# Patient Record
Sex: Male | Born: 1955 | Race: White | Hispanic: No | Marital: Single | Smoking: Former smoker
Health system: Southern US, Community
[De-identification: ages and names within clinical notes are randomized; demographics above are authoritative.]

## PROBLEM LIST (undated history)

## (undated) DIAGNOSIS — M1990 Unspecified osteoarthritis, unspecified site: Secondary | ICD-10-CM

## (undated) DIAGNOSIS — I10 Essential (primary) hypertension: Secondary | ICD-10-CM

---

## 2017-11-11 ENCOUNTER — Inpatient Hospital Stay (HOSPITAL_COMMUNITY)
Admission: EM | Admit: 2017-11-11 | Discharge: 2017-11-29 | DRG: 180 | Disposition: E | Payer: Medicaid Other | Attending: Internal Medicine | Admitting: Internal Medicine

## 2017-11-11 ENCOUNTER — Emergency Department (HOSPITAL_COMMUNITY): Payer: Medicaid Other

## 2017-11-11 ENCOUNTER — Encounter (HOSPITAL_COMMUNITY): Payer: Self-pay | Admitting: Emergency Medicine

## 2017-11-11 DIAGNOSIS — Z885 Allergy status to narcotic agent status: Secondary | ICD-10-CM

## 2017-11-11 DIAGNOSIS — N289 Disorder of kidney and ureter, unspecified: Secondary | ICD-10-CM | POA: Diagnosis present

## 2017-11-11 DIAGNOSIS — W19XXXA Unspecified fall, initial encounter: Secondary | ICD-10-CM | POA: Diagnosis present

## 2017-11-11 DIAGNOSIS — E440 Moderate protein-calorie malnutrition: Secondary | ICD-10-CM | POA: Diagnosis present

## 2017-11-11 DIAGNOSIS — J929 Pleural plaque without asbestos: Secondary | ICD-10-CM | POA: Diagnosis present

## 2017-11-11 DIAGNOSIS — J910 Malignant pleural effusion: Secondary | ICD-10-CM | POA: Diagnosis present

## 2017-11-11 DIAGNOSIS — F17210 Nicotine dependence, cigarettes, uncomplicated: Secondary | ICD-10-CM | POA: Diagnosis present

## 2017-11-11 DIAGNOSIS — Z720 Tobacco use: Secondary | ICD-10-CM

## 2017-11-11 DIAGNOSIS — D689 Coagulation defect, unspecified: Secondary | ICD-10-CM | POA: Diagnosis present

## 2017-11-11 DIAGNOSIS — Z886 Allergy status to analgesic agent status: Secondary | ICD-10-CM

## 2017-11-11 DIAGNOSIS — E871 Hypo-osmolality and hyponatremia: Secondary | ICD-10-CM | POA: Diagnosis present

## 2017-11-11 DIAGNOSIS — R262 Difficulty in walking, not elsewhere classified: Secondary | ICD-10-CM | POA: Diagnosis present

## 2017-11-11 DIAGNOSIS — R64 Cachexia: Secondary | ICD-10-CM | POA: Diagnosis present

## 2017-11-11 DIAGNOSIS — J9811 Atelectasis: Secondary | ICD-10-CM | POA: Diagnosis present

## 2017-11-11 DIAGNOSIS — Z66 Do not resuscitate: Secondary | ICD-10-CM | POA: Diagnosis present

## 2017-11-11 DIAGNOSIS — I10 Essential (primary) hypertension: Secondary | ICD-10-CM | POA: Diagnosis present

## 2017-11-11 DIAGNOSIS — Z880 Allergy status to penicillin: Secondary | ICD-10-CM

## 2017-11-11 DIAGNOSIS — R6510 Systemic inflammatory response syndrome (SIRS) of non-infectious origin without acute organ dysfunction: Secondary | ICD-10-CM | POA: Diagnosis present

## 2017-11-11 DIAGNOSIS — G9341 Metabolic encephalopathy: Secondary | ICD-10-CM

## 2017-11-11 DIAGNOSIS — Z515 Encounter for palliative care: Secondary | ICD-10-CM | POA: Diagnosis present

## 2017-11-11 DIAGNOSIS — Z9889 Other specified postprocedural states: Secondary | ICD-10-CM

## 2017-11-11 DIAGNOSIS — R000 Tachycardia, unspecified: Secondary | ICD-10-CM | POA: Diagnosis present

## 2017-11-11 DIAGNOSIS — Z91018 Allergy to other foods: Secondary | ICD-10-CM

## 2017-11-11 DIAGNOSIS — D649 Anemia, unspecified: Secondary | ICD-10-CM | POA: Diagnosis present

## 2017-11-11 DIAGNOSIS — R17 Unspecified jaundice: Secondary | ICD-10-CM | POA: Diagnosis present

## 2017-11-11 DIAGNOSIS — F1020 Alcohol dependence, uncomplicated: Secondary | ICD-10-CM | POA: Diagnosis present

## 2017-11-11 DIAGNOSIS — M069 Rheumatoid arthritis, unspecified: Secondary | ICD-10-CM | POA: Diagnosis present

## 2017-11-11 DIAGNOSIS — I493 Ventricular premature depolarization: Secondary | ICD-10-CM | POA: Diagnosis present

## 2017-11-11 DIAGNOSIS — R296 Repeated falls: Secondary | ICD-10-CM | POA: Diagnosis present

## 2017-11-11 DIAGNOSIS — R531 Weakness: Secondary | ICD-10-CM

## 2017-11-11 DIAGNOSIS — R6889 Other general symptoms and signs: Secondary | ICD-10-CM | POA: Diagnosis present

## 2017-11-11 DIAGNOSIS — E876 Hypokalemia: Secondary | ICD-10-CM | POA: Diagnosis present

## 2017-11-11 DIAGNOSIS — C384 Malignant neoplasm of pleura: Principal | ICD-10-CM | POA: Diagnosis present

## 2017-11-11 DIAGNOSIS — R911 Solitary pulmonary nodule: Secondary | ICD-10-CM | POA: Diagnosis present

## 2017-11-11 DIAGNOSIS — Z681 Body mass index (BMI) 19 or less, adult: Secondary | ICD-10-CM

## 2017-11-11 DIAGNOSIS — E861 Hypovolemia: Secondary | ICD-10-CM | POA: Diagnosis present

## 2017-11-11 DIAGNOSIS — G92 Toxic encephalopathy: Secondary | ICD-10-CM | POA: Diagnosis not present

## 2017-11-11 DIAGNOSIS — J90 Pleural effusion, not elsewhere classified: Secondary | ICD-10-CM

## 2017-11-11 HISTORY — DX: Unspecified osteoarthritis, unspecified site: M19.90

## 2017-11-11 HISTORY — DX: Essential (primary) hypertension: I10

## 2017-11-11 LAB — BASIC METABOLIC PANEL
ANION GAP: 18 — AB (ref 5–15)
BUN: 48 mg/dL — AB (ref 6–20)
CALCIUM: 8.1 mg/dL — AB (ref 8.9–10.3)
CO2: 20 mmol/L — ABNORMAL LOW (ref 22–32)
Chloride: 90 mmol/L — ABNORMAL LOW (ref 101–111)
Creatinine, Ser: 1.56 mg/dL — ABNORMAL HIGH (ref 0.61–1.24)
GFR calc Af Amer: 53 mL/min — ABNORMAL LOW (ref >60)
GFR, EST NON AFRICAN AMERICAN: 46 mL/min — AB (ref >60)
GLUCOSE: 132 mg/dL — AB (ref 65–99)
POTASSIUM: 3.4 mmol/L — AB (ref 3.5–5.1)
SODIUM: 128 mmol/L — AB (ref 135–145)

## 2017-11-11 LAB — POC OCCULT BLOOD, ED: Fecal Occult Bld: NEGATIVE

## 2017-11-11 LAB — HEPATIC FUNCTION PANEL
ALT: 18 U/L (ref 17–63)
AST: 29 U/L (ref 15–41)
Albumin: 2.2 g/dL — ABNORMAL LOW (ref 3.5–5.0)
Alkaline Phosphatase: 70 U/L (ref 38–126)
BILIRUBIN DIRECT: 1.1 mg/dL — AB (ref 0.1–0.5)
BILIRUBIN INDIRECT: 1.2 mg/dL — AB (ref 0.3–0.9)
TOTAL PROTEIN: 5.9 g/dL — AB (ref 6.5–8.1)
Total Bilirubin: 2.3 mg/dL — ABNORMAL HIGH (ref 0.3–1.2)

## 2017-11-11 LAB — CBC
HEMATOCRIT: 37.7 % — AB (ref 39.0–52.0)
Hemoglobin: 12.9 g/dL — ABNORMAL LOW (ref 13.0–17.0)
MCH: 31.3 pg (ref 26.0–34.0)
MCHC: 34.2 g/dL (ref 30.0–36.0)
MCV: 91.5 fL (ref 78.0–100.0)
PLATELETS: 222 10*3/uL (ref 150–400)
RBC: 4.12 MIL/uL — ABNORMAL LOW (ref 4.22–5.81)
RDW: 13.3 % (ref 11.5–15.5)
WBC: 17.7 10*3/uL — AB (ref 4.0–10.5)

## 2017-11-11 LAB — PROTIME-INR
INR: 1.44
Prothrombin Time: 17.4 seconds — ABNORMAL HIGH (ref 11.4–15.2)

## 2017-11-11 LAB — I-STAT CG4 LACTIC ACID, ED: LACTIC ACID, VENOUS: 3.61 mmol/L — AB (ref 0.5–1.9)

## 2017-11-11 LAB — TYPE AND SCREEN
ABO/RH(D): O POS
ANTIBODY SCREEN: NEGATIVE

## 2017-11-11 LAB — CBG MONITORING, ED: GLUCOSE-CAPILLARY: 134 mg/dL — AB (ref 65–99)

## 2017-11-11 LAB — LIPASE, BLOOD: LIPASE: 34 U/L (ref 11–51)

## 2017-11-11 LAB — APTT: aPTT: 33 seconds (ref 24–36)

## 2017-11-11 LAB — ABO/RH: ABO/RH(D): O POS

## 2017-11-11 MED ORDER — SODIUM CHLORIDE 0.9 % IV SOLN: 2.0000 g | Freq: Once | INTRAVENOUS | Status: DC

## 2017-11-11 MED ORDER — VANCOMYCIN HCL IN DEXTROSE 1-5 GM/200ML-% IV SOLN
1000.0000 mg | Freq: Once | INTRAVENOUS | Status: AC
  Administered 2017-11-11: 1000 mg via INTRAVENOUS
  Filled 2017-11-11: qty 200

## 2017-11-11 MED ORDER — VANCOMYCIN HCL IN DEXTROSE 750-5 MG/150ML-% IV SOLN
750.0000 mg | INTRAVENOUS | Status: DC
  Administered 2017-11-12: 750 mg via INTRAVENOUS
  Filled 2017-11-11: qty 150

## 2017-11-11 MED ORDER — SODIUM CHLORIDE 0.9 % IV BOLUS (SEPSIS)
500.0000 mL | Freq: Once | INTRAVENOUS | Status: AC
  Administered 2017-11-12: 500 mL via INTRAVENOUS

## 2017-11-11 MED ORDER — SODIUM CHLORIDE 0.9 % IV BOLUS (SEPSIS)
1000.0000 mL | Freq: Once | INTRAVENOUS | Status: AC
  Administered 2017-11-11: 1000 mL via INTRAVENOUS

## 2017-11-11 MED ORDER — LEVOFLOXACIN IN D5W 750 MG/150ML IV SOLN: 750.0000 mg | Freq: Once | INTRAVENOUS | Status: DC

## 2017-11-11 MED ORDER — IOPAMIDOL (ISOVUE-300) INJECTION 61%
INTRAVENOUS | Status: AC
  Administered 2017-11-11: 100 mL
  Filled 2017-11-11: qty 100

## 2017-11-11 MED ORDER — SODIUM CHLORIDE 0.9 % IV SOLN
2.0000 g | INTRAVENOUS | Status: DC
  Administered 2017-11-12: 2 g via INTRAVENOUS
  Filled 2017-11-11: qty 2

## 2017-11-11 MED ORDER — SODIUM CHLORIDE 0.9 % IV BOLUS (SEPSIS): 250.0000 mL | Freq: Once | INTRAVENOUS | Status: DC

## 2017-11-11 MED ORDER — IOPAMIDOL (ISOVUE-300) INJECTION 61%
INTRAVENOUS | Status: AC
  Filled 2017-11-11: qty 75

## 2017-11-11 MED ORDER — SODIUM CHLORIDE 0.9 % IV SOLN
2.0000 g | Freq: Once | INTRAVENOUS | Status: AC
  Administered 2017-11-11: 2 g via INTRAVENOUS
  Filled 2017-11-11: qty 2

## 2017-11-11 NOTE — ED Provider Notes (Signed)
CXR shows large right pleural effusion.  CT abdomen is worrisome for malignant effusion with mets to pleural surface.  Likely new cancer diagnosis.    I informed the patient of the CT results and the concern that this could be malignant.    11:56 PM Unable to obtain chest CT at this time due to previous contrasted CT of abdomen.  Will consult medicine for admission.   Montine Circle, PA-C 11/12/17 Charlyn Minerva, April, MD 11/12/17 Laureen Abrahams

## 2017-11-11 NOTE — ED Provider Notes (Addendum)
[facility] EMERGENCY DEPARTMENT Provider Note   CSN: [id number] Arrival date & time: 11/12/2017  2037     History   Chief Complaint Chief Complaint  Patient presents with  . Weakness  . Abdominal Pain    HPI Billy Heath is a 62 y.o. male with a history of hypertension and arthritis in by EMS to the emergency department today for 2-3 weeks of weakness and abdominal pain. Patient reports over the last 2-3 weeks he has had progressive weakness to the point he has difficulty ambulating and now has fallen several times.  He notes that he has lost 20-30 lbs during that time and has no appetite. He reports constant nausea.  He repots generalized abdominal pain with daily dark diarrhea. He is obviously jaundice and reports that he has drank 6 beers/day since turning 20. He also reports that he has severe arthritis and takes 10+ iburpofen daily for the last 10 years. He reports productive cough with green phlegm that has been worsening since onset of his symptoms. He notes some occasional shortness of breath with this but no chest pain. He denies history of COPD but does have a 116 pack year history (3PPD since age 63).  EMS arrived they found that his blood pressure was 82/70 and he received 600 mL's of normal saline in route.  Patient does not follow with a primary care doctor or take any daily medications other than the ibuprofen. He denies any fever, chills, body aches, chills, chest pain, emesis. Does report some decreased urinary output but denies dysuria, hematuria, or urgency.   HPI  Past Medical History:  Diagnosis Date  . Arthritis   . Hypertension     There are no active problems to display for this patient.   The histories are not reviewed yet. Please review them in the "History" navigator section and refresh this [place].    Home Medications    Prior to Admission medications   Not on File    Family History No family history on file.  Social  History Social History   Tobacco Use  . Smoking status: Former Smoker    Last attempt to quit: 10/21/2017    Years since quitting: 0.0  . Smokeless tobacco: Never Used  Substance Use Topics  . Alcohol use: Yes    Alcohol/week: 7.2 oz    Types: 12 Cans of beer per week  . Drug use: No     Allergies   Patient has no allergy information on record.   Review of Systems Review of Systems  All other systems reviewed and are negative.    Physical Exam Updated Vital Signs BP 128/75   Pulse (!) 105   Temp (!) 97.5 F (36.4 C) (Axillary)   Resp 19   Ht 5' 7"  (1.702 m)   Wt 57.6 kg (127 lb)   SpO2 97%   BMI 19.89 kg/m   Physical Exam  Constitutional:  Patient appears frail, older than stated age.  Cachectic.  HENT:  Head: Normocephalic and atraumatic.  Right Ear: External ear normal.  Left Ear: External ear normal.  Nose: Nose normal.  Mouth/Throat: Uvula is midline, oropharynx is clear and moist and mucous membranes are normal. No tonsillar exudate.  Eyes: Pupils are equal, round, and reactive to light. Right eye exhibits no discharge. Left eye exhibits no discharge. No scleral icterus.  Scleral icterus  Neck: Trachea normal. Neck supple. No JVD present. No spinous process tenderness present. Carotid bruit is not present.  No neck rigidity. Normal range of motion present.  Cardiovascular: Normal rate, regular rhythm and intact distal pulses.  No murmur heard. Pulses:      Radial pulses are 2+ on the right side, and 2+ on the left side.       Dorsalis pedis pulses are 2+ on the right side, and 2+ on the left side.       Posterior tibial pulses are 2+ on the right side, and 2+ on the left side.  No lower extremity swelling or edema. Calves symmetric in size bilaterally.  Pulmonary/Chest: Effort normal. No accessory muscle usage. No tachypnea. No respiratory distress. He has decreased breath sounds in the right upper field and the right middle field. He has rhonchi in the  right lower field. He exhibits no tenderness.  Abdominal: Soft. Bowel sounds are normal. There is no hepatosplenomegaly. There is generalized tenderness and tenderness in the right upper quadrant, epigastric area and left upper quadrant. There is no rigidity, no rebound, no guarding and no CVA tenderness.  Genitourinary:  Genitourinary Comments: Chaperone was present.  Patient without pain around the rectal area. Digital Rectal Exam reveals sphincter with good tone. No masses palpated. Stool color is brown with no overt blood or melena.  Musculoskeletal: He exhibits no edema.  Severe arthritic changes of the hands.  Lymphadenopathy:    He has no cervical adenopathy.  Neurological: He is alert.  Skin: Skin is warm and dry. No rash noted. He is not diaphoretic.  Jaundice  Psychiatric: He has a normal mood and affect.  Nursing note and vitals reviewed.    ED Treatments / Results  Labs (all labs ordered are listed, but only abnormal results are displayed) Labs Reviewed  BASIC METABOLIC PANEL - Abnormal; Notable for the following components:      Result Value   Sodium 128 (*)    Potassium 3.4 (*)    Chloride 90 (*)    CO2 20 (*)    Glucose, Bld 132 (*)    BUN 48 (*)    Creatinine, Ser 1.56 (*)    Calcium 8.1 (*)    GFR calc non Af Amer 46 (*)    GFR calc Af Amer 53 (*)    Anion gap 18 (*)    All other components within normal limits  CBC - Abnormal; Notable for the following components:   WBC 17.7 (*)    RBC 4.12 (*)    Hemoglobin 12.9 (*)    HCT 37.7 (*)    All other components within normal limits  HEPATIC FUNCTION PANEL - Abnormal; Notable for the following components:   Total Protein 5.9 (*)    Albumin 2.2 (*)    Total Bilirubin 2.3 (*)    Bilirubin, Direct 1.1 (*)    Indirect Bilirubin 1.2 (*)    All other components within normal limits  CBG MONITORING, ED - Abnormal; Notable for the following components:   Glucose-Capillary 134 (*)    All other components within  normal limits  I-STAT CG4 LACTIC ACID, ED - Abnormal; Notable for the following components:   Lactic Acid, Venous 3.61 (*)    All other components within normal limits  CULTURE, BLOOD (ROUTINE X 2)  CULTURE, BLOOD (ROUTINE X 2)  LIPASE, BLOOD  URINALYSIS, ROUTINE W REFLEX MICROSCOPIC  OCCULT BLOOD X 1 CARD TO LAB, STOOL  PROTIME-INR  APTT  AMMONIA  POC OCCULT BLOOD, ED  TYPE AND SCREEN    EKG  EKG Interpretation None  Radiology No results found.  Procedures Procedures (including critical care time)  Medications Ordered in ED Medications  iopamidol (ISOVUE-300) 61 % injection (not administered)  sodium chloride 0.9 % bolus 1,000 mL (not administered)    And  sodium chloride 0.9 % bolus 500 mL (not administered)    And  sodium chloride 0.9 % bolus 250 mL (not administered)  levofloxacin (LEVAQUIN) IVPB 750 mg (not administered)  aztreonam (AZACTAM) 2 g in sodium chloride 0.9 % 100 mL IVPB (not administered)  vancomycin (VANCOCIN) IVPB 1000 mg/200 mL premix (not administered)     Initial Impression / Assessment and Plan / ED Course  I have reviewed the triage vital signs and the nursing notes.  Pertinent labs & imaging results that were available during my care of the patient were reviewed by me and considered in my medical decision making (see chart for details).     This is a 62 year old frail and ill appearing male that has not had established outpatient medical care in some time presenting with generalized weakness, generalized abdominal pain, nausea, diarrhea, anorexia, 20-30 lb weight loss, and a worsening productive cough over the last 2-3 weeks. Patient was initial found by ems with bp of 82/70 that recovered after IVF.  On arrival the patient is noted to be tachycardic at 105.  He was at home and is afebrile here.  Will obtain lactic acid and rectal temperature to evaluate for sepsis. Patient reports chronic alcohol use and chronic ibuprofen use. He has  generalized abdominal tenderness without peritoneal signs and reports dark diarrhea.  There is concern for upper GI bleed given soft blood pressure on arrival and history.  Will obtain fecal occult blood card and labs anticipating possible transfusion if needed.  CT scan of the abdomen ordered.  Patient also reports 116-pack-year history.  He appears cachectic with decreased breath sounds in the right and rhonchi of the right lower lobe.  Will obtain chest x-ray to evaluate.  Will also send CBC, CMP, lipase, ammonia, urinalysis, and obtain ECG.  Patient rectal exam without occult blood.  Stool guaiac negative.  Patient without anemia that would require transfusion.  Do not feel patient needs further workup for suspected upper GI bleed at this time.  Patient noted to have lactic acid of 3.61.  He has a leukocytosis of 17.  Documented blood pressure of 82/70 by EMS and is tachycardic here.  Patient meets sepsis criteria.  Code sepsis called.  Patient started on broad-spectrum antibiotics for unknown source.   Patient noted to have hyponatremia of 128 and mild hypokalemia of 3.4.  Patient with creatinine of 1.56 and BUN of 48.  Consistent with dehydration.  Unknown if acute kidney injury given no prior labs.  Patient with anion gap of 18 and CO2 of 20.  Albumin noted to be low at 2.2.  Bilirubin elevated 2.3.  AST, ALT and alk phos within normal limits.  With remaining workup pending case signed out to Lorre Munroe, PA-C with anticipation for admission.  Final Clinical Impressions(s) / ED Diagnoses   Final diagnoses:  None    ED Discharge Orders    None       Jillyn Ledger, PA-C 11/25/2017 2254    Jillyn Ledger, PA-C 10/31/2017 2255    Fredia Sorrow, MD 11/12/17 [phone]

## 2017-11-11 NOTE — ED Notes (Signed)
Pt remains in imaging at this time, unable to hang antibiotics at this time

## 2017-11-11 NOTE — Progress Notes (Signed)
Pharmacy Antibiotic Note  Billy Heath is a 62 y.o. male who presented to the [facility] on 11/12/2017 with weakness, abdominal pain, SOB, and hypotension concerning for sepsis. Pharmacy consulted to start Vancomycin + Cefepime for r/o sepsis.   The patient is noted to have a allergy of penicillin as rash. MD okay with trialing a cephalosporin. Discussed monitoring parameters with RN.   Plan: 1. Vancomycin 1g IV x 1 followed by 750 mg IV every 24 hours 2. Cefepime 2g IV x 1 followed by 2g IV every 24 hours 3. Will continue to follow renal function, culture results, LOT, and antibiotic de-escalation plans   Height: 5\' 7"  (170.2 cm) Weight: 127 lb (57.6 kg) IBW/kg (Calculated) : 66.1  Temp (24hrs), Avg:97.5 F (36.4 C), Min:97.5 F (36.4 C), Max:97.5 F (36.4 C)  Recent Labs  Lab 11/08/2017 2100 11/18/2017 2211  WBC 17.7*  --   CREATININE 1.56*  --   LATICACIDVEN  --  3.61*    Estimated Creatinine Clearance: 39.5 mL/min (A) (by C-G formula based on SCr of 1.56 mg/dL (H)).    Allergies  Allergen Reactions  . Onion Shortness Of Breath and Other (See Comments)    Has to take Benadryl to tolerate onions  . Asa [Aspirin] Other (See Comments)    Causes nosebleeds bad enough to warrant a trip to the hospital  . Codeine Nausea And Vomiting  . Penicillins Rash    From childhood: Has patient had a PCN reaction causing immediate rash, facial/tongue/throat swelling, SOB or lightheadedness with hypotension: Yes Has patient had a PCN reaction causing severe rash involving mucus membranes or skin necrosis: Unk Has patient had a PCN reaction that required hospitalization: No Has patient had a PCN reaction occurring within the last 10 years: No If all of the above answers are "NO", then may proceed with Cephalosporin use.     Antimicrobials this admission: Vanc 3/14 >> Cefepime 3/14 >>  Dose adjustments this admission:   Microbiology results: 3/14 BCx >>   Thank you for allowing pharmacy to  be a part of this patient's care.  Lawson Radar 10/31/2017 10:20 PM

## 2017-11-11 NOTE — ED Triage Notes (Signed)
Per [facility], Pt from home. Pt has been having weakness for past 2 weeks. Pt complaining of generalized abdominal pain (8/10), nausea, diarrhea. Pt has also had multiple falls since Monday. EMS reports initial BP 82/70. Pt received 600 ml NS en route. BP currently 128/75.

## 2017-11-11 NOTE — ED Notes (Signed)
Delay in lab draw,  Pt not in room 

## 2017-11-12 ENCOUNTER — Inpatient Hospital Stay (HOSPITAL_COMMUNITY): Payer: Medicaid Other

## 2017-11-12 ENCOUNTER — Other Ambulatory Visit: Payer: Self-pay

## 2017-11-12 ENCOUNTER — Encounter (HOSPITAL_COMMUNITY): Payer: Self-pay | Admitting: Family Medicine

## 2017-11-12 DIAGNOSIS — F1020 Alcohol dependence, uncomplicated: Secondary | ICD-10-CM | POA: Diagnosis present

## 2017-11-12 DIAGNOSIS — R000 Tachycardia, unspecified: Secondary | ICD-10-CM | POA: Diagnosis present

## 2017-11-12 DIAGNOSIS — E876 Hypokalemia: Secondary | ICD-10-CM | POA: Diagnosis present

## 2017-11-12 DIAGNOSIS — J90 Pleural effusion, not elsewhere classified: Secondary | ICD-10-CM | POA: Diagnosis present

## 2017-11-12 DIAGNOSIS — Z880 Allergy status to penicillin: Secondary | ICD-10-CM | POA: Diagnosis not present

## 2017-11-12 DIAGNOSIS — R6889 Other general symptoms and signs: Secondary | ICD-10-CM | POA: Diagnosis not present

## 2017-11-12 DIAGNOSIS — W19XXXA Unspecified fall, initial encounter: Secondary | ICD-10-CM | POA: Diagnosis present

## 2017-11-12 DIAGNOSIS — Z886 Allergy status to analgesic agent status: Secondary | ICD-10-CM | POA: Diagnosis not present

## 2017-11-12 DIAGNOSIS — J929 Pleural plaque without asbestos: Secondary | ICD-10-CM | POA: Diagnosis not present

## 2017-11-12 DIAGNOSIS — D689 Coagulation defect, unspecified: Secondary | ICD-10-CM | POA: Diagnosis present

## 2017-11-12 DIAGNOSIS — R17 Unspecified jaundice: Secondary | ICD-10-CM

## 2017-11-12 DIAGNOSIS — R262 Difficulty in walking, not elsewhere classified: Secondary | ICD-10-CM | POA: Diagnosis present

## 2017-11-12 DIAGNOSIS — E861 Hypovolemia: Secondary | ICD-10-CM | POA: Diagnosis present

## 2017-11-12 DIAGNOSIS — J910 Malignant pleural effusion: Secondary | ICD-10-CM | POA: Diagnosis present

## 2017-11-12 DIAGNOSIS — R64 Cachexia: Secondary | ICD-10-CM | POA: Diagnosis present

## 2017-11-12 DIAGNOSIS — R911 Solitary pulmonary nodule: Secondary | ICD-10-CM | POA: Diagnosis present

## 2017-11-12 DIAGNOSIS — R296 Repeated falls: Secondary | ICD-10-CM | POA: Diagnosis present

## 2017-11-12 DIAGNOSIS — J9811 Atelectasis: Secondary | ICD-10-CM | POA: Diagnosis present

## 2017-11-12 DIAGNOSIS — N289 Disorder of kidney and ureter, unspecified: Secondary | ICD-10-CM | POA: Diagnosis present

## 2017-11-12 DIAGNOSIS — G92 Toxic encephalopathy: Secondary | ICD-10-CM | POA: Diagnosis not present

## 2017-11-12 DIAGNOSIS — E871 Hypo-osmolality and hyponatremia: Secondary | ICD-10-CM | POA: Diagnosis present

## 2017-11-12 DIAGNOSIS — I493 Ventricular premature depolarization: Secondary | ICD-10-CM | POA: Diagnosis present

## 2017-11-12 DIAGNOSIS — E440 Moderate protein-calorie malnutrition: Secondary | ICD-10-CM | POA: Diagnosis present

## 2017-11-12 DIAGNOSIS — Z681 Body mass index (BMI) 19 or less, adult: Secondary | ICD-10-CM | POA: Diagnosis not present

## 2017-11-12 DIAGNOSIS — I10 Essential (primary) hypertension: Secondary | ICD-10-CM | POA: Diagnosis present

## 2017-11-12 DIAGNOSIS — C384 Malignant neoplasm of pleura: Secondary | ICD-10-CM | POA: Diagnosis present

## 2017-11-12 DIAGNOSIS — R6510 Systemic inflammatory response syndrome (SIRS) of non-infectious origin without acute organ dysfunction: Secondary | ICD-10-CM | POA: Diagnosis present

## 2017-11-12 DIAGNOSIS — M069 Rheumatoid arthritis, unspecified: Secondary | ICD-10-CM | POA: Diagnosis present

## 2017-11-12 DIAGNOSIS — Z885 Allergy status to narcotic agent status: Secondary | ICD-10-CM | POA: Diagnosis not present

## 2017-11-12 LAB — BODY FLUID CELL COUNT WITH DIFFERENTIAL
Lymphs, Fluid: 30 %
Monocyte-Macrophage-Serous Fluid: 11 % — ABNORMAL LOW (ref 50–90)
NEUTROPHIL FLUID: 59 % — AB (ref 0–25)
Total Nucleated Cell Count, Fluid: 1560 cu mm — ABNORMAL HIGH (ref 0–1000)

## 2017-11-12 LAB — ALBUMIN, PLEURAL OR PERITONEAL FLUID: Albumin, Fluid: 1.8 g/dL

## 2017-11-12 LAB — CBC WITH DIFFERENTIAL/PLATELET
Basophils Absolute: 0 10*3/uL (ref 0.0–0.1)
Basophils Relative: 0 %
EOS PCT: 0 %
Eosinophils Absolute: 0 10*3/uL (ref 0.0–0.7)
HCT: 35.1 % — ABNORMAL LOW (ref 39.0–52.0)
Hemoglobin: 11.7 g/dL — ABNORMAL LOW (ref 13.0–17.0)
LYMPHS PCT: 13 %
Lymphs Abs: 1.7 10*3/uL (ref 0.7–4.0)
MCH: 30.4 pg (ref 26.0–34.0)
MCHC: 33.3 g/dL (ref 30.0–36.0)
MCV: 91.2 fL (ref 78.0–100.0)
MONO ABS: 1 10*3/uL (ref 0.1–1.0)
MONOS PCT: 8 %
Neutro Abs: 10.3 10*3/uL — ABNORMAL HIGH (ref 1.7–7.7)
Neutrophils Relative %: 79 %
PLATELETS: 207 10*3/uL (ref 150–400)
RBC: 3.85 MIL/uL — AB (ref 4.22–5.81)
RDW: 13.3 % (ref 11.5–15.5)
WBC: 13 10*3/uL — ABNORMAL HIGH (ref 4.0–10.5)

## 2017-11-12 LAB — GLUCOSE, PLEURAL OR PERITONEAL FLUID: Glucose, Fluid: 31 mg/dL

## 2017-11-12 LAB — GRAM STAIN

## 2017-11-12 LAB — COMPREHENSIVE METABOLIC PANEL
ALBUMIN: 1.9 g/dL — AB (ref 3.5–5.0)
ALT: 18 U/L (ref 17–63)
AST: 32 U/L (ref 15–41)
Alkaline Phosphatase: 61 U/L (ref 38–126)
Anion gap: 12 (ref 5–15)
BUN: 43 mg/dL — AB (ref 6–20)
CHLORIDE: 96 mmol/L — AB (ref 101–111)
CO2: 20 mmol/L — AB (ref 22–32)
Calcium: 7.6 mg/dL — ABNORMAL LOW (ref 8.9–10.3)
Creatinine, Ser: 1.32 mg/dL — ABNORMAL HIGH (ref 0.61–1.24)
GFR calc Af Amer: >60 mL/min (ref >60)
GFR calc non Af Amer: 56 mL/min — ABNORMAL LOW (ref >60)
GLUCOSE: 120 mg/dL — AB (ref 65–99)
POTASSIUM: 3.3 mmol/L — AB (ref 3.5–5.1)
Sodium: 128 mmol/L — ABNORMAL LOW (ref 135–145)
Total Bilirubin: 2.3 mg/dL — ABNORMAL HIGH (ref 0.3–1.2)
Total Protein: 5 g/dL — ABNORMAL LOW (ref 6.5–8.1)

## 2017-11-12 LAB — URINALYSIS, ROUTINE W REFLEX MICROSCOPIC
Bacteria, UA: NONE SEEN
Bilirubin Urine: NEGATIVE
GLUCOSE, UA: NEGATIVE mg/dL
KETONES UR: 5 mg/dL — AB
LEUKOCYTES UA: NEGATIVE
NITRITE: NEGATIVE
PH: 6 (ref 5.0–8.0)
Protein, ur: 30 mg/dL — AB
SPECIFIC GRAVITY, URINE: 1.041 — AB (ref 1.005–1.030)

## 2017-11-12 LAB — LACTIC ACID, PLASMA
Lactic Acid, Venous: 1.8 mmol/L (ref 0.5–1.9)
Lactic Acid, Venous: 1.3 mmol/L (ref 0.5–1.9)

## 2017-11-12 LAB — MRSA PCR SCREENING: MRSA by PCR: NEGATIVE

## 2017-11-12 LAB — PROTEIN, PLEURAL OR PERITONEAL FLUID: Total protein, fluid: 4.4 g/dL

## 2017-11-12 LAB — I-STAT CG4 LACTIC ACID, ED: Lactic Acid, Venous: 5.74 mmol/L (ref 0.5–1.9)

## 2017-11-12 LAB — LACTATE DEHYDROGENASE, PLEURAL OR PERITONEAL FLUID: LD, Fluid: 711 U/L — ABNORMAL HIGH (ref 3–23)

## 2017-11-12 LAB — PROTIME-INR
INR: 1.35
Prothrombin Time: 16.6 seconds — ABNORMAL HIGH (ref 11.4–15.2)

## 2017-11-12 LAB — AMMONIA: Ammonia: 17 umol/L (ref 9–35)

## 2017-11-12 LAB — LACTATE DEHYDROGENASE: LDH: 261 U/L — AB (ref 98–192)

## 2017-11-12 LAB — TSH: TSH: 1.552 u[IU]/mL (ref 0.350–4.500)

## 2017-11-12 LAB — CBG MONITORING, ED: GLUCOSE-CAPILLARY: 104 mg/dL — AB (ref 65–99)

## 2017-11-12 LAB — MAGNESIUM: Magnesium: 2.6 mg/dL — ABNORMAL HIGH (ref 1.7–2.4)

## 2017-11-12 MED ORDER — POTASSIUM CHLORIDE IN NACL 20-0.9 MEQ/L-% IV SOLN
INTRAVENOUS | Status: DC
  Administered 2017-11-12: 03:00:00 via INTRAVENOUS
  Filled 2017-11-12 (×2): qty 1000

## 2017-11-12 MED ORDER — ACETAMINOPHEN 650 MG RE SUPP: 650.0000 mg | Freq: Four times a day (QID) | RECTAL | Status: DC | PRN

## 2017-11-12 MED ORDER — FOLIC ACID 1 MG PO TABS
1.0000 mg | ORAL_TABLET | Freq: Every day | ORAL | Status: DC
  Filled 2017-11-12: qty 1

## 2017-11-12 MED ORDER — LORAZEPAM 2 MG/ML IJ SOLN
2.0000 mg | INTRAMUSCULAR | Status: DC | PRN
  Administered 2017-11-13: 3 mg via INTRAVENOUS
  Filled 2017-11-12: qty 2

## 2017-11-12 MED ORDER — HYDROCODONE-ACETAMINOPHEN 5-325 MG PO TABS: 1.0000 | ORAL_TABLET | ORAL | Status: DC | PRN

## 2017-11-12 MED ORDER — SODIUM CHLORIDE 0.9 % IV SOLN
INTRAVENOUS | Status: DC
  Administered 2017-11-12 – 2017-11-13 (×4): via INTRAVENOUS

## 2017-11-12 MED ORDER — ADULT MULTIVITAMIN W/MINERALS CH
1.0000 | ORAL_TABLET | Freq: Every day | ORAL | Status: DC
  Filled 2017-11-12: qty 1

## 2017-11-12 MED ORDER — ENSURE ENLIVE PO LIQD: 237.0000 mL | Freq: Two times a day (BID) | ORAL | Status: DC

## 2017-11-12 MED ORDER — VITAMIN B-1 100 MG PO TABS
100.0000 mg | ORAL_TABLET | Freq: Every day | ORAL | Status: DC
  Administered 2017-11-12: 100 mg via ORAL
  Filled 2017-11-12: qty 1

## 2017-11-12 MED ORDER — ONDANSETRON HCL 4 MG PO TABS: 4.0000 mg | ORAL_TABLET | Freq: Four times a day (QID) | ORAL | Status: DC | PRN

## 2017-11-12 MED ORDER — SODIUM CHLORIDE 0.9% FLUSH
3.0000 mL | Freq: Two times a day (BID) | INTRAVENOUS | Status: DC
  Administered 2017-11-12 – 2017-11-15 (×7): 3 mL via INTRAVENOUS

## 2017-11-12 MED ORDER — POTASSIUM CHLORIDE CRYS ER 20 MEQ PO TBCR
40.0000 meq | EXTENDED_RELEASE_TABLET | Freq: Once | ORAL | Status: DC
  Filled 2017-11-12: qty 2

## 2017-11-12 MED ORDER — FENTANYL CITRATE (PF) 100 MCG/2ML IJ SOLN
25.0000 ug | INTRAMUSCULAR | Status: DC | PRN
  Administered 2017-11-12: 50 ug via INTRAVENOUS
  Filled 2017-11-12: qty 2

## 2017-11-12 MED ORDER — ONDANSETRON HCL 4 MG/2ML IJ SOLN: 4.0000 mg | Freq: Four times a day (QID) | INTRAMUSCULAR | Status: DC | PRN

## 2017-11-12 MED ORDER — ACETAMINOPHEN 325 MG PO TABS: 650.0000 mg | ORAL_TABLET | Freq: Four times a day (QID) | ORAL | Status: DC | PRN

## 2017-11-12 MED ORDER — LIDOCAINE HCL (PF) 1 % IJ SOLN
INTRAMUSCULAR | Status: AC
  Filled 2017-11-12: qty 30

## 2017-11-12 NOTE — Progress Notes (Signed)
Attempted to give pt PO meds from this morning, pt unable to swallow pills safely. Pt tried to swallow pills whole with ginger ale and was unable to do so. This RN then attempted to give pt meds in applesauce and pt still spit meds out and started coughing.Will alert MD and continue to monitor pt.

## 2017-11-12 NOTE — Procedures (Signed)
PROCEDURE SUMMARY:  Successful US guided right diagnostic and therapeutic thoracentesis. Yielded 1 L of bloody, dark fluid. Pt tolerated procedure well. No immediate complications.  Specimen was sent for labs. CXR ordered.  Docia Barrier PA-C 11/12/2017 1:56 PM

## 2017-11-12 NOTE — Progress Notes (Signed)
  PROGRESS NOTE  Patient admitted earlier this morning. See H&P. Billy Heath is a 62 y.o. male with medical history significant for rheumatoid arthritis, alcoholism, and tobacco abuse, now presenting to the emergency department for evaluation of 2-3 weeks of generalized weakness, malaise, abdominal discomfort, nausea, cough, and shortness of breath.  Patient reports that the symptoms developed insidiously and have been progressively worsening. He reports falling several times at home due to generalized weakness, but denies hitting his head or losing consciousness.  He reports ongoing daily alcohol use and smokes up to 3 packs/day. In the ED, CXR revealed large right pleural effusion. CT abd/pelvis also revealed large right pleural effusion, likely malignant with thickened and nodular pleura consistent with metastatic disease. Complete collapse of the right lung is noted on the CT, but there are no acute intra-abdominal or pelvic abnormalities identified. Patient was started on empiric vancomycin and cefepime and ordered for thoracentesis.   Patient seen and examined.  No acute complaints.  Physical exam reveals chronicaally ill-appearing male with diminished right sided breath sounds.   -Hold off on CT chest with contrast for another day, continue IV hydration and if Cr remains stable, will check CT chest tomorrow -IR thoracentesis of right with cytology due to concern for malignancy -CIWA -IVF -Replace potassium  -Continue vanco/cefepime   Called sister at patient's request, but no answer.    [name], DO [facility] [email or web address] Password [facility] 11/12/2017, 12:26 PM

## 2017-11-12 NOTE — ED Notes (Signed)
Pt to go to [place] for tests, pt still dozing on and off coughing and spitting, occaioionaly has bloodtinged sputum

## 2017-11-12 NOTE — ED Notes (Signed)
Pt dozes on and off, awaits thoracentesis, aaox 4 when awake, family in room . Marland Kitchen

## 2017-11-12 NOTE — H&P (Addendum)
History and Physical    Yanixan Mellinger ZOX:[id number] DOB: 1956/08/24 DOA: 11/22/2017  PCP: [facility]   Patient coming from: Home  Chief Complaint: Gen weakness, abd pain, nausea  HPI: Eldrick Penick is a 62 y.o. male with medical history significant for arthritis, alcoholism, and tobacco abuse, now presenting to the emergency department for evaluation of 2-3 weeks of generalized weakness, malaise, abdominal discomfort, nausea, cough, and shortness of breath.  Patient reports that the symptoms developed insidiously and have been progressively worsening.  He denies fevers or chills.  He reports falling several times at home due to generalized weakness, but denies hitting his head or losing consciousness.  He reports ongoing daily alcohol use and smokes up to 3 packs/day.  ED Course: Upon arrival to the ED, patient is found to be afebrile, saturating adequately on room air, mildly tachycardic, and with stable blood pressure.  EKG features a sinus tachycardia with rate 111 and PVCs.  Chest x-ray is notable for a large right pleural effusion and compressive atelectasis.  CT of the abdomen and pelvis with contrast reveals the large right pleural effusion, likely malignant with thickened and nodular pleura consistent with metastatic disease.  Complete collapse of the right lung is noted on the CT, but there are no acute intra-abdominal or pelvic abnormalities identified.  Chemistry panel is notable for sodium of 128, potassium 3.4, bicarbonate 20, BUN 48, and creatinine 1.56 with no priors for comparison.  Total bilirubin is elevated to 2.3 and albumin is low at 2.2.  CBC is notable for leukocytosis to 17700 and normocytic anemia with hemoglobin of 12.9.  Fecal occult blood testing was negative.  Lactic acid is elevated to 3.61 and INR is elevated to 1.44.  Blood cultures were collected and 30 cc/kg normal saline bolus given.  The patient was started on empiric vancomycin and  cefepime.  He remains slightly tachycardic with stable blood pressure and no acute respiratory distress.  Lactic acid has increased to 5.77 despite fluid resuscitation.  He will be admitted to the stepdown unit for ongoing evaluation and management of large right pleural effusion, suspected to be malignant.  Review of Systems:  All other systems reviewed and apart from HPI, are negative.  Past Medical History:  Diagnosis Date  . Arthritis   . Hypertension     History reviewed. No pertinent surgical history.   reports that he quit smoking about 3 weeks ago. he has never used smokeless tobacco. He reports that he drinks about 7.2 oz of alcohol per week. He reports that he does not use drugs.  Allergies  Allergen Reactions  . Onion Shortness Of Breath and Other (See Comments)    Has to take Benadryl to tolerate onions  . Asa [Aspirin] Other (See Comments)    Causes nosebleeds bad enough to warrant a trip to the hospital  . Codeine Nausea And Vomiting  . Penicillins Rash    From childhood: Has patient had a PCN reaction causing immediate rash, facial/tongue/throat swelling, SOB or lightheadedness with hypotension: Yes Has patient had a PCN reaction causing severe rash involving mucus membranes or skin necrosis: Unk Has patient had a PCN reaction that required hospitalization: No Has patient had a PCN reaction occurring within the last 10 years: No If all of the above answers are "NO", then may proceed with Cephalosporin use.     Family History  Problem Relation Age of Onset  . Lung cancer Mother      Prior to  Admission medications   Medication Sig Start Date End Date Taking? Authorizing Provider  diphenhydrAMINE (BENADRYL) 25 mg capsule Take 25 mg by mouth every 6 (six) hours as needed for allergies.    Yes [provider]  ibuprofen (ADVIL,MOTRIN) 200 MG tablet Take 400 mg by mouth every 6 (six) hours as needed (for pain).   Yes [provider]    Physical  Exam: Vitals:   11/06/2017 2346 11/12/17 0000 11/12/17 0015 11/12/17 0032  BP:  135/82 135/79 133/87  Pulse:    78  Resp:  20 19 18   Temp: 97.9 F (36.6 C)   (!) 97.5 F (36.4 C)  TempSrc: Rectal   Oral  SpO2:    95%  Weight:      Height:          Constitutional: NAD, calm, cachectic  Eyes: PERTLA, lids and conjunctivae normal ENMT: Mucous membranes are moist. Posterior pharynx clear of any exudate or lesions.   Neck: normal, supple, no masses, no thyromegaly Respiratory: Breath sounds markedly diminished on right.  Mild dyspnea with speech. No accessory muscle use.  Cardiovascular: Rate ~110 and regular. No extremity edema.   Abdomen: No distension, mild generalized tenderness, soft. Bowel sounds active.  Musculoskeletal: no clubbing / cyanosis. Marked ulnar deviation of fingers bilaterally.    Skin: no significant rashes, lesions, ulcers. Jaundice. Poor turgor. Neurologic: CN 2-12 grossly intact. Sensation intact. Strength 5/5 in all 4 limbs.  Psychiatric:  Alert and oriented x 3.Pleasant, cooperative.    Labs on Admission: I have personally reviewed following labs and imaging studies  CBC: Recent Labs  Lab 11/27/2017 2100  WBC 17.7*  HGB 12.9*  HCT 37.7*  MCV 91.5  PLT 706   Basic Metabolic Panel: Recent Labs  Lab 10/29/2017 2100  NA 128*  K 3.4*  CL 90*  CO2 20*  GLUCOSE 132*  BUN 48*  CREATININE 1.56*  CALCIUM 8.1*   GFR: Estimated Creatinine Clearance: 39.5 mL/min (A) (by C-G formula based on SCr of 1.56 mg/dL (H)). Liver Function Tests: Recent Labs  Lab 11/28/2017 2100  AST 29  ALT 18  ALKPHOS 70  BILITOT 2.3*  PROT 5.9*  ALBUMIN 2.2*   Recent Labs  Lab 10/31/2017 2100  LIPASE 34   Recent Labs  Lab 11/12/17 0024  AMMONIA 17   Coagulation Profile: Recent Labs  Lab 11/17/2017 2100  INR 1.44   Cardiac Enzymes: No results for input(s): CKTOTAL, CKMB, CKMBINDEX, TROPONINI in the last 168 hours. BNP (last 3 results) No results for input(s):  PROBNP in the last 8760 hours. HbA1C: No results for input(s): HGBA1C in the last 72 hours. CBG: Recent Labs  Lab 11/09/2017 2158  GLUCAP 134*   Lipid Profile: No results for input(s): CHOL, HDL, LDLCALC, TRIG, CHOLHDL, LDLDIRECT in the last 72 hours. Thyroid Function Tests: No results for input(s): TSH, T4TOTAL, FREET4, T3FREE, THYROIDAB in the last 72 hours. Anemia Panel: No results for input(s): VITAMINB12, FOLATE, FERRITIN, TIBC, IRON, RETICCTPCT in the last 72 hours. Urine analysis: No results found for: COLORURINE, APPEARANCEUR, LABSPEC, PHURINE, GLUCOSEU, HGBUR, BILIRUBINUR, KETONESUR, PROTEINUR, UROBILINOGEN, NITRITE, LEUKOCYTESUR Sepsis Labs: @LABRCNTIP (procalcitonin:4,lacticidven:4) )No results found for this or any previous visit (from the past 240 hour(s)).   Radiological Exams on Admission: Dg Chest 2 View  Result Date: 11/25/2017 CLINICAL DATA:  62 year old male with cough. EXAM: CHEST - 2 VIEW COMPARISON:  None. FINDINGS: There is a large right pleural effusion with opacification of the mid to lower lung field and compression  of the right lower lobe. The left lung is clear. No pneumothorax. The cardiac silhouette is within normal limits. There is osteopenia. No acute osseous pathology. IMPRESSION: Large right pleural effusion with compressive atelectasis of the right lower lobe. Further evaluation with chest CT with contrast is recommended. Electronically Signed   By: Anner Crete M.D.   On: 11/17/2017 23:15   Ct Abdomen Pelvis W Contrast  Result Date: 11/20/2017 CLINICAL DATA:  62 year old male with weight loss and generalized abdominal pain. EXAM: CT ABDOMEN AND PELVIS WITH CONTRAST TECHNIQUE: Multidetector CT imaging of the abdomen and pelvis was performed using the standard protocol following bolus administration of intravenous contrast. CONTRAST:  152mL ISOVUE-300 IOPAMIDOL (ISOVUE-300) INJECTION 61% COMPARISON:  Chest radiograph dated 11/18/2017 FINDINGS: Lower  chest: Partially visualized large right pleural effusion with somewhat loculated appearance inferiorly. There is complete collapse of the visualized portion of the right lower lobe. There is thickening of the pleural with areas of nodularity along the inferior right pleural surface concerning for metastatic implants. There is mild mass effect and shift of the mediastinum to the left hemithorax. There is a 4 mm left lower lobe nodule. There is coronary vascular calcification. There is no intra-abdominal free air or free fluid. Hepatobiliary: No focal liver abnormality is seen. No gallstones, gallbladder wall thickening, or biliary dilatation. Pancreas: Unremarkable. No pancreatic ductal dilatation or surrounding inflammatory changes. Spleen: Normal in size without focal abnormality. Adrenals/Urinary Tract: The adrenal glands are unremarkable. There is no hydronephrosis on either side. The visualized ureters and urinary bladder appear unremarkable. Probable small bilateral renal vascular calcification. Stomach/Bowel: There is no bowel obstruction or active inflammation. The appendix is normal. Vascular/Lymphatic: Advanced aortoiliac atherosclerotic disease. No aneurysmal dilatation or evidence of dissection. The SMV, splenic vein, and main portal vein are patent. No portal venous gas. There is no adenopathy. Reproductive: The prostate and seminal vesicles are grossly unremarkable. Other: None Musculoskeletal: Osteopenia.  No acute osseous pathology. IMPRESSION: 1. Partially visualized large right pleural effusion most consistent with malignant effusion. There is thickening and nodularity of the inferior right pleural surface consistent with metastatic disease. There is associated complete collapse of the visualized portion of the right lung. CT of the chest with IV contrast is recommended for further evaluation. Consider thoracentesis for diagnostic purposes as well as relief of symptoms. 2. A 4 mm left lower lobe  pulmonary nodule. 3. No acute intra-abdominal or pelvic pathology. No evidence of metastatic disease in the abdomen and pelvis. 4.  Aortic Atherosclerosis (ICD10-I70.0). Electronically Signed   By: Anner Crete M.D.   On: 11/10/2017 23:54    EKG: Independently reviewed. Sinus tachycardia (rate 111), PVC's.   Assessment/Plan  1. Large right pleural effusion; thickened nodular pleura; lung nodule - Presents with numerous complaints including unintentional wt loss, DOE and cough; has smoked up to 3 ppd for many years, has FHx of lung cancer  - Found to have large right pleural effusion with thickened nodular pleura concerning for malignancy  - CT chest with contrast recommended by radiology, but just received contrast for CT abd/pelvis and has elevated creatinine - IR consulted for thoracentesis with fluid-studies  - Consider CT chest with contrast after IVF hydration overnight  - Continue supportive care with prn supplemental O2, analgesia   2. SIRS  - Tachycardia, leukocytosis, and elevated lactate noted on admission  - Chest imaging with large right pleural effusion, suspected to be malignant, difficult to exclude associated pneumonia  - UA remains pending  - CT abd/pelvis with  no acute abdominal or pelvic findings  - Could certainly be secondary to malignancy, but concomitant infection difficult to exclude  - Cultured in ED, given 30 cc/kg NS, and started on empiric vancomycin and Zosyn  - Continue empiric abx for now while following cultures and clinical course   3. Hyponatremia  - Serum sodium is 128 in setting of hypovolemia  - Fluid-resuscitated in ED with 30 cc/kg NS  - Repeat chem panel in am   4. Jaundice  - Presents with generalized abdominal pain, found to have elevated bilirubin and INR, low albumin, CT abd/pelvis with no acute intraabdominal or pelvic pathology  - No liver lesions on CT, possibly secondary to alcoholism, hemolysis considered, will check LDH and  haptoglobin    5. Alcoholism  - No signs of withdrawal on admission  - Monitor with CIWA and prn Ativan, supplement vitamins and minerals, monitor lytes    6. Kidney disease of unknown chronicity  - SCr is 1.56 on admission with no prior labs available  - Suspect an acute prerenal component in setting of anorexia, clinical dehydration  - Fluid-resuscitated in ED with NS, continued on NS infusion  - Repeat chem panel in am    DVT prophylaxis: SCD's  Code Status: Full  Family Communication: Discussed with patient Consults called: None Admission status: Inpatient    Vianne Bulls, MD [facility] Pager [phone]  If 7PM-7AM, please contact night-coverage [email or web address] Password [facility]  11/12/2017, 2:43 AM

## 2017-11-12 NOTE — Plan of Care (Signed)
Patient is new admission and continues to work towards careplan goals.

## 2017-11-13 DIAGNOSIS — E440 Moderate protein-calorie malnutrition: Secondary | ICD-10-CM

## 2017-11-13 LAB — HEPATIC FUNCTION PANEL
ALT: 25 U/L (ref 17–63)
AST: 45 U/L — AB (ref 15–41)
Albumin: 1.7 g/dL — ABNORMAL LOW (ref 3.5–5.0)
Alkaline Phosphatase: 61 U/L (ref 38–126)
BILIRUBIN DIRECT: 0.6 mg/dL — AB (ref 0.1–0.5)
BILIRUBIN TOTAL: 1.6 mg/dL — AB (ref 0.3–1.2)
Indirect Bilirubin: 1 mg/dL — ABNORMAL HIGH (ref 0.3–0.9)
Total Protein: 4.7 g/dL — ABNORMAL LOW (ref 6.5–8.1)

## 2017-11-13 LAB — BASIC METABOLIC PANEL
ANION GAP: 10 (ref 5–15)
BUN: 22 mg/dL — ABNORMAL HIGH (ref 6–20)
CALCIUM: 7.5 mg/dL — AB (ref 8.9–10.3)
CO2: 18 mmol/L — ABNORMAL LOW (ref 22–32)
Chloride: 104 mmol/L (ref 101–111)
Creatinine, Ser: 0.94 mg/dL (ref 0.61–1.24)
GFR calc Af Amer: >60 mL/min (ref >60)
GFR calc non Af Amer: >60 mL/min (ref >60)
Glucose, Bld: 94 mg/dL (ref 65–99)
Potassium: 3.3 mmol/L — ABNORMAL LOW (ref 3.5–5.1)
Sodium: 132 mmol/L — ABNORMAL LOW (ref 135–145)

## 2017-11-13 LAB — CBC
HEMATOCRIT: 31.3 % — AB (ref 39.0–52.0)
Hemoglobin: 10.2 g/dL — ABNORMAL LOW (ref 13.0–17.0)
MCH: 30.5 pg (ref 26.0–34.0)
MCHC: 32.6 g/dL (ref 30.0–36.0)
MCV: 93.7 fL (ref 78.0–100.0)
Platelets: 153 10*3/uL (ref 150–400)
RBC: 3.34 MIL/uL — ABNORMAL LOW (ref 4.22–5.81)
RDW: 13.6 % (ref 11.5–15.5)
WBC: 11 10*3/uL — AB (ref 4.0–10.5)

## 2017-11-13 LAB — BLOOD GAS, ARTERIAL
ACID-BASE DEFICIT: 6.6 mmol/L — AB (ref 0.0–2.0)
BICARBONATE: 16.9 mmol/L — AB (ref 20.0–28.0)
Drawn by: 518061
O2 Content: 2 L/min
O2 Saturation: 95.7 %
PCO2 ART: 24.8 mmHg — AB (ref 32.0–48.0)
PH ART: 7.444 (ref 7.350–7.450)
Patient temperature: 97
pO2, Arterial: 77.7 mmHg — ABNORMAL LOW (ref 83.0–108.0)

## 2017-11-13 LAB — URINE CULTURE: Culture: NO GROWTH

## 2017-11-13 LAB — GLUCOSE, CAPILLARY: Glucose-Capillary: 83 mg/dL (ref 65–99)

## 2017-11-13 LAB — LACTIC ACID, PLASMA
LACTIC ACID, VENOUS: 3.2 mmol/L — AB (ref 0.5–1.9)
Lactic Acid, Venous: 1.8 mmol/L (ref 0.5–1.9)

## 2017-11-13 LAB — MAGNESIUM: Magnesium: 2.4 mg/dL (ref 1.7–2.4)

## 2017-11-13 MED ORDER — POTASSIUM CHLORIDE 10 MEQ/100ML IV SOLN
10.0000 meq | INTRAVENOUS | Status: AC
Start: 2017-11-13 — End: 2017-11-13
  Administered 2017-11-13 (×4): 10 meq via INTRAVENOUS
  Filled 2017-11-13 (×4): qty 100

## 2017-11-13 MED ORDER — NALOXONE HCL 0.4 MG/ML IJ SOLN
0.4000 mg | Freq: Once | INTRAMUSCULAR | Status: AC
  Administered 2017-11-13: 0.4 mg via INTRAVENOUS
  Filled 2017-11-13: qty 1

## 2017-11-13 MED ORDER — HEPARIN SODIUM (PORCINE) 5000 UNIT/ML IJ SOLN
5000.0000 [IU] | Freq: Three times a day (TID) | INTRAMUSCULAR | Status: DC
  Administered 2017-11-13 – 2017-11-14 (×4): 5000 [IU] via SUBCUTANEOUS
  Filled 2017-11-13 (×4): qty 1

## 2017-11-13 MED ORDER — MORPHINE SULFATE (PF) 2 MG/ML IV SOLN: 1.0000 mg | INTRAVENOUS | Status: DC | PRN

## 2017-11-13 NOTE — Progress Notes (Signed)
Initial Nutrition Assessment  DOCUMENTATION CODES:   Non-severe (moderate) malnutrition in context of chronic illness  INTERVENTION:   RD will monitor for diet advancement vs GOC  NUTRITION DIAGNOSIS:   Moderate Malnutrition related to cancer and cancer related treatments, chronic illness(COPD, etoh abuse) as evidenced by moderate fat depletion, moderate muscle depletion.  GOAL:   Patient will meet greater than or equal to 90% of their needs  MONITOR:   PO intake, Supplement acceptance, Labs, Weight trends, I & O's, Skin  REASON FOR ASSESSMENT:   Malnutrition Screening Tool    ASSESSMENT:   62 y.o. male with medical history significant for arthritis, alcoholism, and tobacco abuse, now presenting to the emergency department for evaluation of 2-3 weeks of generalized weakness, malaise, abdominal discomfort, nausea, cough, and shortness of breath. CT abd/pelvis also revealed large right pleural effusion, likely malignant with thickened and nodular pleura consistent with metastatic disease.   Pt s/p thoracentesis with 1L fluid removal 3/15  Visited pt's room today. Pt with poor prognosis and non responsive at time of RD visit. Family at bedside reports that pt is a good eater at baseline and up until recently would eat "everything in sight". Pt does require a soft diet with ground meats r/t poor dentition. Pt does not drink any supplements at home. Pt's sister reports that pt's UBW is around 120lbs; she states he has always been little. Pt currently NPO. RD will monitor for GOC vs diet advancement. Pt with h/o etoh abuse; would recommend check Mg, and P as pt likely will be at risk for deficiencies.     Medications reviewed and include: heparin, NaCl @100ml /hr, KCl  Labs reviewed: Na 132(L), K 3.3(L), BUN 22(H), Ca 7.5(L) adj. 9.34 wnl, alb 1.7(L), AST 45(H), tbili 1.6(H) Wbc- 11.0(H), Hgb 10.2(L), Hct 31.3(L)  NUTRITION - FOCUSED PHYSICAL EXAM:    Most Recent Value  Orbital  Region  Mild depletion  Upper Arm Region  Mild depletion  Thoracic and Lumbar Region  Mild depletion  Buccal Region  Moderate depletion  Temple Region  Moderate depletion  Clavicle Bone Region  Mild depletion  Clavicle and Acromion Bone Region  Mild depletion  Scapular Bone Region  Unable to assess  Dorsal Hand  Mild depletion  Patellar Region  Moderate depletion  Anterior Thigh Region  Moderate depletion  Posterior Calf Region  Moderate depletion  Edema (RD Assessment)  None  Hair  Reviewed  Eyes  Reviewed  Mouth  Reviewed  Skin  Reviewed  Nails  Reviewed       Diet Order:  Diet NPO time specified  EDUCATION NEEDS:   Not appropriate for education at this time  Skin: Reviewed RN Assessment  Last BM:  3/14  Height:   Ht Readings from Last 1 Encounters:  11/12/17 5\' 7"  (1.702 m)    Weight:   Wt Readings from Last 1 Encounters:  11/13/17 114 lb 13.8 oz (52.1 kg)    Ideal Body Weight:  67.3 kg  BMI:  Body mass index is 17.99 kg/m.  Estimated Nutritional Needs:   Kcal:  1600-1900kcal/day   Protein:  78-88g/day   Fluid:  >1.3L/day   Koleen Distance MS, RD, LDN Pager #[phone] After Hours Pager: [phone]

## 2017-11-13 NOTE — Progress Notes (Signed)
 PROGRESS NOTE    Billy Heath  YQI:[id number] DOB: 1955/09/17 DOA: 11/21/2017 PCP: [facility]     Brief Narrative:  Billy Heath a 62 y.o.malewith medical history significant for rheumatoidarthritis, alcoholism, and tobacco abuse, now presenting to the emergency department for evaluation of 2-3 weeks of generalized weakness, malaise, abdominal discomfort, nausea, cough, and shortness of breath. Patient reports that the symptoms developed insidiously and have been progressively worsening.He reports falling several times at home due to generalized weakness, but denies hitting his head or losing consciousness. He reports ongoing daily alcohol use and smokes up to 3 packs/day. In the ED, CXR revealed large right pleural effusion. CT abd/pelvis also revealed large right pleural effusion, likely malignant with thickened and nodular pleura consistent with metastatic disease. Complete collapse of the right lung is noted on the CT, but there are no acute intra-abdominal or pelvic abnormalities identified. Patient was started on empiric vancomycin and cefepime and ordered for thoracentesis.   Assessment & Plan:   Principal Problem:   Pleural effusion Active Problems:   Hyponatremia   SIRS (systemic inflammatory response syndrome) (HCC)   Lung nodule   Kidney disease   Coagulopathy (HCC)   Jaundice   Suspected malignant neoplasm   Alcoholism ([place])   Pleural thickening   Right pleural effusion -Thoracentesis 3/15, exudative fluid. Gram stain negative for organism. Concern for malignancy. Cytology pending -Stop antibiotics -On [place] appears loculated   Acute toxic encephalopathy -This morning, patient is very lethargic, difficult to keep awake. He received Fentanyl 50 mcg at 2124 and Ativan 3 mg at 0058. Will give 0.4 mg IV narcan now. Stat ABG ordered. I don't think he is a good candidate for BiPAP due to his poor mentation.   Alcohol abuse -CIWA -Folate,  thiamine   Tobacco abuse -Reportedly uses 3 packs daily    DVT prophylaxis: subq hep Code Status: DNR. Confirmed with sister who is his only living family who is currently involved in his life. He has no living parents, no spouse. He has 2 adult children in their 30-40s but are not involved in his life. Sister does not have formal HCPOA but tells me that she is his only family member who has been involved in his care. She confirmed with me multiple times that patient is a DNR and would not want resuscitation or heroic measures even if it meant saving his life. She states that they have talked about this at length when their mother was dying from cancer. She is coming to the hospital and will call his children to see if they also want to come.  Family Communication: Sister over the phone updated.  Disposition Plan: Very poor prognosis   Consultants:   None  Procedures:   Thoracentesis 3/15   Antimicrobials:  Anti-infectives (From admission, onward)   Start     Dose/Rate Route Frequency Ordered Stop   11/12/17 2300  vancomycin (VANCOCIN) IVPB 750 mg/150 ml premix  Status:  Discontinued     750 mg 150 mL/hr over 60 Minutes Intravenous Every 24 hours 11/07/2017 2241 11/13/17 0724   11/12/17 2300  ceFEPIme (MAXIPIME) 2 g in sodium chloride 0.9 % 100 mL IVPB  Status:  Discontinued     2 g 200 mL/hr over 30 Minutes Intravenous Every 24 hours  2241 11/13/17 0725   11/24/2017 2230  levofloxacin (LEVAQUIN) IVPB 750 mg  Status:  Discontinued     750 mg 100 mL/hr over 90 Minutes Intravenous  Once 11/10/2017 2218  11/18/2017 2226   11/07/2017 2230  aztreonam (AZACTAM) 2 g in sodium chloride 0.9 % 100 mL IVPB  Status:  Discontinued     2 g 200 mL/hr over 30 Minutes Intravenous  Once 11/05/2017 2218 11/19/2017 2226   11/23/2017 2230  vancomycin (VANCOCIN) IVPB 1000 mg/200 mL premix     1,000 mg 200 mL/hr over 60 Minutes Intravenous  Once 11/14/2017 2218 11/12/17 0044   11/09/2017 2230  ceFEPIme (MAXIPIME)  2 g in sodium chloride 0.9 % 100 mL IVPB     2 g 200 mL/hr over 30 Minutes Intravenous  Once 11/13/2017 2226 11/12/17 0044       Subjective: Not able to provide ROS due to decreased mentation   Objective: Vitals:   11/13/17 0400 11/13/17 0413 11/13/17 0500 11/13/17 0800  BP:  95/62  114/68  Pulse:  85  96  Resp:  (!) 21    Temp: 98.2 F (36.8 C) (!) 97 F (36.1 C)    TempSrc: Axillary Axillary    SpO2:  100%    Weight:   52.1 kg (114 lb 13.8 oz)   Height:        Intake/Output Summary (Last 24 hours) at 11/13/2017 0843 Last data filed at 11/13/2017 0400 Gross per 24 hour  Intake 1523 ml  Output 750 ml  Net 773 ml   Filed Weights   11/08/2017 2043 11/12/17 2030 11/13/17 0500  Weight: 57.6 kg (127 lb) 52.1 kg (114 lb 13.8 oz) 52.1 kg (114 lb 13.8 oz)    Examination:  General exam: Appears calm and comfortable  Respiratory system: Tachypneic, diminished breath sounds  Cardiovascular system: Tachycardic, irregular rhythm  Gastrointestinal system: Abdomen is nondistended, soft and nontender. No organomegaly or masses felt Central nervous system: Alert to loud voice, but falls asleep quickly. Does not answer questions appropriately.  Extremities: Symmetric  Skin: No rashes, lesions or ulcers  Data Reviewed: I have personally reviewed following labs and imaging studies  CBC: Recent Labs  Lab 11/18/2017 2100 11/12/17 0424  WBC 17.7* 13.0*  NEUTROABS  --  10.3*  HGB 12.9* 11.7*  HCT 37.7* 35.1*  MCV 91.5 91.2  PLT 222 703   Basic Metabolic Panel: Recent Labs  Lab 10/31/2017 2100 11/12/17 0424  NA 128* 128*  K 3.4* 3.3*  CL 90* 96*  CO2 20* 20*  GLUCOSE 132* 120*  BUN 48* 43*  CREATININE 1.56* 1.32*  CALCIUM 8.1* 7.6*  MG  --  2.6*   GFR: Estimated Creatinine Clearance: 42.2 mL/min (A) (by C-G formula based on SCr of 1.32 mg/dL (H)). Liver Function Tests: Recent Labs  Lab 11/12/2017 2100 11/12/17 0424  AST 29 32  ALT 18 18  ALKPHOS 70 61  BILITOT 2.3*  2.3*  PROT 5.9* 5.0*  ALBUMIN 2.2* 1.9*   Recent Labs  Lab 10/29/2017 2100  LIPASE 34   Recent Labs  Lab 11/12/17 0024  AMMONIA 17   Coagulation Profile: Recent Labs  Lab 11/06/2017 2100 11/12/17 0424  INR 1.44 1.35   Cardiac Enzymes: No results for input(s): CKTOTAL, CKMB, CKMBINDEX, TROPONINI in the last 168 hours. BNP (last 3 results) No results for input(s): PROBNP in the last 8760 hours. HbA1C: No results for input(s): HGBA1C in the last 72 hours. CBG: Recent Labs  Lab 11/02/2017 2158 11/12/17 0805 11/13/17 0743  GLUCAP 134* 104* 83   Lipid Profile: No results for input(s): CHOL, HDL, LDLCALC, TRIG, CHOLHDL, LDLDIRECT in the last 72 hours. Thyroid Function Tests: Recent Labs  11/12/17 0424  TSH 1.552   Anemia Panel: No results for input(s): VITAMINB12, FOLATE, FERRITIN, TIBC, IRON, RETICCTPCT in the last 72 hours. Sepsis Labs: Recent Labs  Lab 11/22/2017 2211 11/12/17 0031 11/12/17 0424 11/12/17 0800  LATICACIDVEN 3.61* 5.74* 1.8 1.3    Recent Results (from the past 240 hour(s))  Blood Culture (routine x 2)     Status: None (Preliminary result)   Collection Time: 11/10/2017 11:30 PM  Result Value Ref Range Status   Specimen Description BLOOD LEFT ARM  Final   Special Requests   Final    BOTTLES DRAWN AEROBIC ONLY Blood Culture adequate volume Performed at [facility], [place] [address], [place], [place] [address]    Culture PENDING  Incomplete   Report Status PENDING  Incomplete  Blood Culture (routine x 2)     Status: None (Preliminary result)   Collection Time: 11/02/2017 11:33 PM  Result Value Ref Range Status   Specimen Description BLOOD RIGHT HAND  Final   Special Requests   Final    BOTTLES DRAWN AEROBIC ONLY Blood Culture results may not be optimal due to an inadequate volume of blood received in culture bottles Performed at [facility] [address] [place], [place] [address]    Culture PENDING  Incomplete   Report Status  PENDING  Incomplete  Gram stain     Status: None   Collection Time: 11/12/17  2:07 PM  Result Value Ref Range Status   Specimen Description FLUID RIGHT PLEURAL  Final   Special Requests NONE  Final   Gram Stain   Final    FEW WBC PRESENT,BOTH PMN AND MONONUCLEAR NO ORGANISMS SEEN Performed at [facility], [address], [place], [place] [address]    Report Status 11/12/2017 FINAL  Final  MRSA PCR Screening     Status: None   Collection Time: 11/12/17  4:42 PM  Result Value Ref Range Status   MRSA by PCR NEGATIVE NEGATIVE Final    Comment:        The GeneXpert MRSA Assay (FDA approved for NASAL specimens only), is one component of a comprehensive MRSA colonization surveillance program. It is not intended to diagnose MRSA infection nor to guide or monitor treatment for MRSA infections. Performed at [facility], [place] [address] [place], [place] [address]        Radiology Studies: Dg Chest 1 View  Result Date: 11/12/2017 CLINICAL DATA:  Status post right-sided thoracentesis EXAM: CHEST  1 VIEW COMPARISON:  11/11/2013 FINDINGS: Interval reduction in right-sided pleural effusion is noted consistent with the given clinical history of thoracentesis. No pneumothorax is identified. Persistent fluid is seen likely related to underlying loculations as noted on prior ultrasound. The left lung is clear. No bony abnormality is noted. IMPRESSION: No pneumothorax following right thoracentesis. Moderate pleural effusion remains with evidence of loculation on recent ultrasound. Electronically Signed   By: Inez Catalina M.D.   On: 11/12/2017 14:17   Dg Chest 2 View  Result Date: 11/13/2017 CLINICAL DATA:  62 year old male with cough. EXAM: CHEST - 2 VIEW COMPARISON:  None. FINDINGS: There is a large right pleural effusion with opacification of the mid to lower lung field and compression of the right lower lobe. The left lung is clear. No pneumothorax. The cardiac silhouette is within  normal limits. There is osteopenia. No acute osseous pathology. IMPRESSION: Large right pleural effusion with compressive atelectasis of the right lower lobe. Further evaluation with chest  CT with contrast is recommended. Electronically Signed   By: Anner Crete M.D.   On: 11/10/2017 23:15   Ct Abdomen Pelvis W Contrast  Result Date: 11/24/2017 CLINICAL DATA:  62 year old male with weight loss and generalized abdominal pain. EXAM: CT ABDOMEN AND PELVIS WITH CONTRAST TECHNIQUE: Multidetector CT imaging of the abdomen and pelvis was performed using the standard protocol following bolus administration of intravenous contrast. CONTRAST:  153mL ISOVUE-300 IOPAMIDOL (ISOVUE-300) INJECTION 61% COMPARISON:  Chest radiograph dated 11/18/2017 FINDINGS: Lower chest: Partially visualized large right pleural effusion with somewhat loculated appearance inferiorly. There is complete collapse of the visualized portion of the right lower lobe. There is thickening of the pleural with areas of nodularity along the inferior right pleural surface concerning for metastatic implants. There is mild mass effect and shift of the mediastinum to the left hemithorax. There is a 4 mm left lower lobe nodule. There is coronary vascular calcification. There is no intra-abdominal free air or free fluid. Hepatobiliary: No focal liver abnormality is seen. No gallstones, gallbladder wall thickening, or biliary dilatation. Pancreas: Unremarkable. No pancreatic ductal dilatation or surrounding inflammatory changes. Spleen: Normal in size without focal abnormality. Adrenals/Urinary Tract: The adrenal glands are unremarkable. There is no hydronephrosis on either side. The visualized ureters and urinary bladder appear unremarkable. Probable small bilateral renal vascular calcification. Stomach/Bowel: There is no bowel obstruction or active inflammation. The appendix is normal. Vascular/Lymphatic: Advanced aortoiliac atherosclerotic disease. No  aneurysmal dilatation or evidence of dissection. The SMV, splenic vein, and main portal vein are patent. No portal venous gas. There is no adenopathy. Reproductive: The prostate and seminal vesicles are grossly unremarkable. Other: None Musculoskeletal: Osteopenia.  No acute osseous pathology. IMPRESSION: 1. Partially visualized large right pleural effusion most consistent with malignant effusion. There is thickening and nodularity of the inferior right pleural surface consistent with metastatic disease. There is associated complete collapse of the visualized portion of the right lung. CT of the chest with IV contrast is recommended for further evaluation. Consider thoracentesis for diagnostic purposes as well as relief of symptoms. 2. A 4 mm left lower lobe pulmonary nodule. 3. No acute intra-abdominal or pelvic pathology. No evidence of metastatic disease in the abdomen and pelvis. 4.  Aortic Atherosclerosis (ICD10-I70.0). Electronically Signed   By: Anner Crete M.D.   On: 11/03/2017 23:54   US Thoracentesis Asp Pleural Space W/img Guide  Result Date: 11/12/2017 INDICATION: Patient with shortness of breath and right pleural effusion. Request is made for diagnostic and therapeutic thoracentesis. EXAM: ULTRASOUND GUIDED DIAGNOSTIC AND THERAPEUTIC RIGHT THORACENTESIS MEDICATIONS: 10 mL 1% lidocaine COMPLICATIONS: None immediate. PROCEDURE: An ultrasound guided thoracentesis was thoroughly discussed with the patient and questions answered. The benefits, risks, alternatives and complications were also discussed. The patient understands and wishes to proceed with the procedure. Written consent was obtained. Ultrasound was performed to localize and mark an adequate pocket of fluid in the right chest. The area was then prepped and draped in the normal sterile fashion. 1% Lidocaine was used for local anesthesia. Under ultrasound guidance a 19 gauge, 7-cm, Yueh catheter was introduced. Thoracentesis was performed.  The catheter was removed and a dressing applied. FINDINGS: A total of approximately 1.0 liters of dark, bloody fluid was removed. Samples were sent to the laboratory as requested by the clinical team. IMPRESSION: Successful ultrasound guided diagnostic and therapeutic right thoracentesis yielding 1.0 liters of pleural fluid. Read by: Brynda Greathouse PA-C Electronically Signed   By: Aletta Edouard M.D.   On: 11/12/2017 14:52  Scheduled Meds: . feeding supplement (ENSURE ENLIVE)  237 mL Oral BID BM  . folic acid  1 mg Oral Daily  . heparin  5,000 Units Subcutaneous Q8H  . multivitamin with minerals  1 tablet Oral Daily  . potassium chloride  40 mEq Oral Once  . sodium chloride flush  3 mL Intravenous Q12H  . thiamine  100 mg Oral Daily   Continuous Infusions: . sodium chloride 100 mL/hr at 11/13/17 0117  . sodium chloride       LOS: 1 day    Time spent: 40 minutes   [name], DO [facility] [email or web address] Password [facility] 11/13/2017, 8:43 AM

## 2017-11-13 NOTE — Progress Notes (Signed)
SLP Cancellation Note  Patient Details Name: Billy Heath MRN: [id number] DOB: [birth year]   Cancelled treatment:       Reason Eval/Treat Not Completed: Fatigue/lethargy limiting ability to participate. D/w RN; pt agitated when awake. Will defer swallow evaluation. RN to page if appropriate.  Deneise Lever, [place], [place] Speech-Language Pathologist [place] 11/13/2017, 1:37 PM

## 2017-11-14 MED ORDER — ORAL CARE MOUTH RINSE: 15.0000 mL | Freq: Two times a day (BID) | OROMUCOSAL | Status: DC

## 2017-11-14 MED ORDER — MORPHINE SULFATE (PF) 4 MG/ML IV SOLN
1.0000 mg | INTRAVENOUS | Status: DC | PRN
  Administered 2017-11-14: 2 mg via INTRAVENOUS
  Administered 2017-11-14 – 2017-11-15 (×5): 1 mg via INTRAVENOUS
  Filled 2017-11-14 (×8): qty 1

## 2017-11-14 MED ORDER — LORAZEPAM 2 MG/ML IJ SOLN
1.0000 mg | INTRAMUSCULAR | Status: DC | PRN
  Administered 2017-11-15 (×2): 1 mg via INTRAVENOUS
  Filled 2017-11-14 (×3): qty 1

## 2017-11-14 MED ORDER — HALOPERIDOL LACTATE 5 MG/ML IJ SOLN: 5.0000 mg | Freq: Four times a day (QID) | INTRAMUSCULAR | Status: DC | PRN

## 2017-11-14 MED ORDER — MORPHINE SULFATE (PF) 2 MG/ML IV SOLN
1.0000 mg | INTRAVENOUS | Status: DC | PRN
Start: 2017-11-14 — End: 2017-11-14

## 2017-11-14 MED ORDER — MORPHINE SULFATE (PF) 2 MG/ML IV SOLN
1.0000 mg | INTRAVENOUS | Status: DC | PRN
  Administered 2017-11-14: 2 mg via INTRAVENOUS
  Filled 2017-11-14: qty 1

## 2017-11-14 MED ORDER — CHLORHEXIDINE GLUCONATE 0.12 % MT SOLN: 15.0000 mL | Freq: Two times a day (BID) | OROMUCOSAL | Status: DC

## 2017-11-14 NOTE — Progress Notes (Addendum)
PROGRESS NOTE    Billy Heath  MIW:[id number] DOB: [birth year] DOA: 11/13/2017 PCP: [facility]     Brief Narrative:  Billy Heath a 62 y.o.malewith medical history significant for rheumatoidarthritis, alcoholism, and tobacco abuse, now presenting to the emergency department for evaluation of 2-3 weeks of generalized weakness, malaise, abdominal discomfort, nausea, cough, and shortness of breath. Patient reports that the symptoms developed insidiously and have been progressively worsening.He reports falling several times at home due to generalized weakness, but denies hitting his head or losing consciousness. He reports ongoing daily alcohol use and smokes up to 3 packs/day. In the ED, CXR revealed large right pleural effusion. CT abd/pelvis also revealed large right pleural effusion, likely malignant with thickened and nodular pleura consistent with metastatic disease. Complete collapse of the right lung is noted on the CT, but there are no acute intra-abdominal or pelvic abnormalities identified. Patient was started on empiric vancomycin and cefepime and ordered for thoracentesis. Pleural fluid revealed exudative fluid. On 3/16, he had acute encephalopathy, difficult to arouse. Family changed status to DNR. He was monitored overnight; on morning of 3/17, he was more alert but remained confused. Due to his suffering, family decided to transition him to full comfort care.   Assessment & Plan:   Principal Problem:   Pleural effusion Active Problems:   Hyponatremia   SIRS (systemic inflammatory response syndrome) (HCC)   Lung nodule   Kidney disease   Coagulopathy (HCC)   Jaundice   Suspected malignant neoplasm   Alcoholism ([place])   Pleural thickening   Malnutrition of moderate degree   Right pleural effusion -Thoracentesis 3/15, exudative fluid. Gram stain negative for organism. Concern for malignancy. Cytology pending -Stop antibiotics -On [place] appears  loculated   Acute toxic encephalopathy -Very lethargic 3/16 -More alert 3/17 but remains confused. Not oriented to place or year (although able to tell me "hospital" and "[place]" when asked). Continues to ask for water (NPO due to waiting for SLP eval when more alert).   Alcohol abuse -Sister states his last alcohol use was 3 weeks ago   Tobacco abuse -Reportedly uses 3 packs daily   Goals of care -Patient has had significant decline since admission. I met with multiple family members including sister and daughter on 3/16. I met with sister again 3/17 and spoke with daughter over the phone 3/17. Unable to reach son. Patient's next of kin are his son and daughter, but sister tells me they are not involved in care and last saw patient 2-3 years ago. Sister tells me that patient is clearly suffering and earlier today asked her for a gun. I spoke with daughter over the phone who states that she and her brother will give away their rights to give sister full medical decision making capacity. Daughter planning to come to hospital later today to fill out any necessary paperwork. Daughter tells me do whatever we need to do to keep him comfortable. She agreed with comfort care even if it meant shortening his life. Sister and daughter agree that they're sure his son would be in agreement with this decision.   DVT prophylaxis: none, comfort care  Code Status: DNR. Confirmed with sister at bedside  Family Communication: Sister at bedside, daughter over the phone Disposition Plan: Full comfort care   Consultants:   None  Procedures:   Thoracentesis 3/15   Antimicrobials:  Anti-infectives (From admission, onward)   Start     Dose/Rate Route Frequency Ordered Stop   11/12/17  2300  vancomycin (VANCOCIN) IVPB 750 mg/150 ml premix  Status:  Discontinued     750 mg 150 mL/hr over 60 Minutes Intravenous Every 24 hours 11/20/2017 2241 11/13/17 0724   11/12/17 2300  ceFEPIme (MAXIPIME) 2 g in sodium  chloride 0.9 % 100 mL IVPB  Status:  Discontinued     2 g 200 mL/hr over 30 Minutes Intravenous Every 24 hours 11/20/2017 2241 11/13/17 0725   11/17/2017 2230  levofloxacin (LEVAQUIN) IVPB 750 mg  Status:  Discontinued     750 mg 100 mL/hr over 90 Minutes Intravenous  Once 11/10/2017 2218 11/21/2017 2226   11/07/2017 2230  aztreonam (AZACTAM) 2 g in sodium chloride 0.9 % 100 mL IVPB  Status:  Discontinued     2 g 200 mL/hr over 30 Minutes Intravenous  Once 11/26/2017 2218 11/01/2017 2226   11/08/2017 2230  vancomycin (VANCOCIN) IVPB 1000 mg/200 mL premix     1,000 mg 200 mL/hr over 60 Minutes Intravenous  Once 11/25/2017 2218 11/12/17 0044   11/08/2017 2230  ceFEPIme (MAXIPIME) 2 g in sodium chloride 0.9 % 100 mL IVPB     2 g 200 mL/hr over 30 Minutes Intravenous  Once 11/26/2017 2226 11/12/17 0044       Subjective: Not able to provide ROS due to decreased mentation    Objective: Vitals:   11/14/17 0100 11/14/17 0200 11/14/17 0400 11/14/17 0900  BP: 117/80 (!) 143/59 131/74 (!) 146/91  Pulse: 85 84 90   Resp: (!) 29 (!) 29 (!) 28   Temp:   98 F (36.7 C)   TempSrc:   Oral   SpO2: 100% 100% 100%   Weight:   51.6 kg (113 lb 12.1 oz)   Height:        Intake/Output Summary (Last 24 hours) at 11/14/2017 0905 Last data filed at 11/14/2017 0444 Gross per 24 hour  Intake 2810 ml  Output 1101 ml  Net 1709 ml   Filed Weights   11/12/17 2030 11/13/17 0500 11/14/17 0400  Weight: 52.1 kg (114 lb 13.8 oz) 52.1 kg (114 lb 13.8 oz) 51.6 kg (113 lb 12.1 oz)    Examination:  General exam: Appears calm, confused   Respiratory system: Tachypneic, diminished breath sounds  Cardiovascular system: Tachycardic, regular rhythm  Gastrointestinal system: Abdomen is nondistended, soft and nontender. No organomegaly or masses felt Central nervous system: Alert but not oriented, does not answer questions appropriately. Remembers thoracentesis procedure but unable to answer questions regarding his hospitalization or  appropriate ROS questions  Extremities: Symmetric  Skin: No rashes, lesions or ulcers  Data Reviewed: I have personally reviewed following labs and imaging studies  CBC: Recent Labs  Lab 11/10/2017 2100 11/12/17 0424 11/13/17 0845  WBC 17.7* 13.0* 11.0*  NEUTROABS  --  10.3*  --   HGB 12.9* 11.7* 10.2*  HCT 37.7* 35.1* 31.3*  MCV 91.5 91.2 93.7  PLT 222 207 287   Basic Metabolic Panel: Recent Labs  Lab 11/25/2017 2100 11/12/17 0424 11/13/17 0845  NA 128* 128* 132*  K 3.4* 3.3* 3.3*  CL 90* 96* 104  CO2 20* 20* 18*  GLUCOSE 132* 120* 94  BUN 48* 43* 22*  CREATININE 1.56* 1.32* 0.94  CALCIUM 8.1* 7.6* 7.5*  MG  --  2.6* 2.4   GFR: Estimated Creatinine Clearance: 58.7 mL/min (by C-G formula based on SCr of 0.94 mg/dL). Liver Function Tests: Recent Labs  Lab 11/07/2017 2100 11/12/17 0424 11/13/17 0845  AST 29 32 45*  ALT 18  18 25  ALKPHOS 70 61 61  BILITOT 2.3* 2.3* 1.6*  PROT 5.9* 5.0* 4.7*  ALBUMIN 2.2* 1.9* 1.7*   Recent Labs  Lab 11/21/2017 2100  LIPASE 34   Recent Labs  Lab 11/12/17 0024  AMMONIA 17   Coagulation Profile: Recent Labs  Lab 11/23/2017 2100 11/12/17 0424  INR 1.44 1.35   Cardiac Enzymes: No results for input(s): CKTOTAL, CKMB, CKMBINDEX, TROPONINI in the last 168 hours. BNP (last 3 results) No results for input(s): PROBNP in the last 8760 hours. HbA1C: No results for input(s): HGBA1C in the last 72 hours. CBG: Recent Labs  Lab 11/04/2017 2158 11/12/17 0805 11/13/17 0743  GLUCAP 134* 104* 83   Lipid Profile: No results for input(s): CHOL, HDL, LDLCALC, TRIG, CHOLHDL, LDLDIRECT in the last 72 hours. Thyroid Function Tests: Recent Labs    11/12/17 0424  TSH 1.552   Anemia Panel: No results for input(s): VITAMINB12, FOLATE, FERRITIN, TIBC, IRON, RETICCTPCT in the last 72 hours. Sepsis Labs: Recent Labs  Lab 11/12/17 0424 11/12/17 0800 11/13/17 1010 11/13/17 1431  LATICACIDVEN 1.8 1.3 3.2* 1.8    Recent Results (from  the past 240 hour(s))  Blood Culture (routine x 2)     Status: None (Preliminary result)   Collection Time: 11/04/2017 11:30 PM  Result Value Ref Range Status   Specimen Description BLOOD LEFT ARM  Final   Special Requests   Final    BOTTLES DRAWN AEROBIC ONLY Blood Culture adequate volume   Culture   Final    NO GROWTH 1 DAY Performed at [facility], [place] [address] [place], [place] [address]    Report Status PENDING  Incomplete  Blood Culture (routine x 2)     Status: None (Preliminary result)   Collection Time: 11/28/2017 11:33 PM  Result Value Ref Range Status   Specimen Description BLOOD RIGHT HAND  Final   Special Requests   Final    BOTTLES DRAWN AEROBIC ONLY Blood Culture results may not be optimal due to an inadequate volume of blood received in culture bottles   Culture   Final    NO GROWTH 1 DAY Performed at [facility], [place] [address] [place], [place] [address]    Report Status PENDING  Incomplete  Urine culture     Status: None   Collection Time: 11/12/17  1:30 AM  Result Value Ref Range Status   Specimen Description URINE, RANDOM  Final   Special Requests NONE  Final   Culture   Final    NO GROWTH Performed at [facility], [place] [address], [place], [place] [address]    Report Status 11/13/2017 FINAL  Final  Culture, body fluid-bottle     Status: None (Preliminary result)   Collection Time: 11/12/17  2:07 PM  Result Value Ref Range Status   Specimen Description FLUID RIGHT PLEURAL  Final   Special Requests BOTTLES DRAWN AEROBIC AND ANAEROBIC 10CC  Final   Culture   Final    NO GROWTH < 24 HOURS Performed at [facility], [place] [address], [place], [place] [address]    Report Status PENDING  Incomplete  Gram stain     Status: None   Collection Time: 11/12/17  2:07 PM  Result Value Ref Range Status   Specimen Description FLUID RIGHT PLEURAL  Final   Special Requests NONE  Final   Gram Stain   Final    FEW WBC PRESENT,BOTH PMN AND  MONONUCLEAR NO  ORGANISMS SEEN Performed at [facility], [place] [address] [place], [place] [address]    Report Status 11/12/2017 FINAL  Final  MRSA PCR Screening     Status: None   Collection Time: 11/12/17  4:42 PM  Result Value Ref Range Status   MRSA by PCR NEGATIVE NEGATIVE Final    Comment:        The GeneXpert MRSA Assay (FDA approved for NASAL specimens only), is one component of a comprehensive MRSA colonization surveillance program. It is not intended to diagnose MRSA infection nor to guide or monitor treatment for MRSA infections. Performed at [facility], [place] [address] [place], [place] [address]        Radiology Studies: Dg Chest 1 View  Result Date: 11/12/2017 CLINICAL DATA:  Status post right-sided thoracentesis EXAM: CHEST  1 VIEW COMPARISON:  11/11/2013 FINDINGS: Interval reduction in right-sided pleural effusion is noted consistent with the given clinical history of thoracentesis. No pneumothorax is identified. Persistent fluid is seen likely related to underlying loculations as noted on prior ultrasound. The left lung is clear. No bony abnormality is noted. IMPRESSION: No pneumothorax following right thoracentesis. Moderate pleural effusion remains with evidence of loculation on recent ultrasound. Electronically Signed   By: Inez Catalina M.D.   On: 11/12/2017 14:17   US Thoracentesis Asp Pleural Space W/img Guide  Result Date: 11/12/2017 INDICATION: Patient with shortness of breath and right pleural effusion. Request is made for diagnostic and therapeutic thoracentesis. EXAM: ULTRASOUND GUIDED DIAGNOSTIC AND THERAPEUTIC RIGHT THORACENTESIS MEDICATIONS: 10 mL 1% lidocaine COMPLICATIONS: None immediate. PROCEDURE: An ultrasound guided thoracentesis was thoroughly discussed with the patient and questions answered. The benefits, risks, alternatives and complications were also discussed. The patient understands and wishes to proceed with the procedure.  Written consent was obtained. Ultrasound was performed to localize and mark an adequate pocket of fluid in the right chest. The area was then prepped and draped in the normal sterile fashion. 1% Lidocaine was used for local anesthesia. Under ultrasound guidance a 19 gauge, 7-cm, Yueh catheter was introduced. Thoracentesis was performed. The catheter was removed and a dressing applied. FINDINGS: A total of approximately 1.0 liters of dark, bloody fluid was removed. Samples were sent to the laboratory as requested by the clinical team. IMPRESSION: Successful ultrasound guided diagnostic and therapeutic right thoracentesis yielding 1.0 liters of pleural fluid. Read by: Brynda Greathouse PA-C Electronically Signed   By: Aletta Edouard M.D.   On: 11/12/2017 14:52      Scheduled Meds: . sodium chloride flush  3 mL Intravenous Q12H   Continuous Infusions:    LOS: 2 days    Time spent: 30 minutes   [name], DO [facility] [email or web address] Password [email or web address] 11/14/2017, 9:05 AM

## 2017-11-14 NOTE — Progress Notes (Addendum)
CSW attempted to meet with patient's sister to discuss discharge plan of residential hospice. CSW met with patient's nephew, Lennette Bihari, at the bedside who said he didn't know the discharge plan and that his mom had stepped out for some fresh air. Lennette Bihari attempted to call his mom with no answer. CSW left card with CSW phone number for patient's sister to call when she returns to the room.   CSW will continue to follow.  Laveda Abbe, [place] Clinical Social Worker [phone]

## 2017-11-14 NOTE — Progress Notes (Signed)
Pt transferred from 2W-12 to 6N-11 via bed. Pt comfort care. Family aware of move.  Report called to receiving nurse. Pt clean, dry, without distress at time of transfer. Foley and PIV intact.

## 2017-11-14 NOTE — Progress Notes (Addendum)
No acute events overnight, patient remained stable.   Patient refusing nasal cannula, oxygen saturation maintained greater than 95% without supplemental oxygen. Patient remains confused but more alert this this morning asking for water, became upset when he was educated on NPO status, declined mouth care   Sister [facility]) called this AM, and said she spoke with his children and they agreed to come in today and sign any forms to give [facility].

## 2017-11-16 DIAGNOSIS — Z720 Tobacco use: Secondary | ICD-10-CM

## 2017-11-16 DIAGNOSIS — G9341 Metabolic encephalopathy: Secondary | ICD-10-CM

## 2017-11-17 LAB — CULTURE, BLOOD (ROUTINE X 2)
Culture: NO GROWTH
Culture: NO GROWTH
Special Requests: ADEQUATE

## 2017-11-17 LAB — HIV 1/2 AB DIFFERENTIATION
HIV 1 Ab: NEGATIVE
HIV 2 Ab: NEGATIVE
Note: NEGATIVE

## 2017-11-17 LAB — CULTURE, BODY FLUID-BOTTLE

## 2017-11-17 LAB — CULTURE, BODY FLUID W GRAM STAIN -BOTTLE: Culture: NO GROWTH

## 2017-11-17 LAB — HIV ANTIBODY (ROUTINE TESTING W REFLEX): HIV Screen 4th Generation wRfx: REACTIVE — AB

## 2017-11-17 LAB — RNA QUALITATIVE: HIV 1 RNA Qualitative: 1

## 2017-11-29 NOTE — Progress Notes (Signed)
PROGRESS NOTE    Billy Heath  AGT:[id number] DOB: December 21, 1955 DOA: 11/05/2017 PCP: [facility], [place] [facility]     Brief Narrative:  Billy Heath a 62 y.o.malewith medical history significant for rheumatoidarthritis, alcoholism, and tobacco abuse, now presenting to the emergency department for evaluation of 2-3 weeks of generalized weakness, malaise, abdominal discomfort, nausea, cough, and shortness of breath. Patient reports that the symptoms developed insidiously and have been progressively worsening.He reports falling several times at home due to generalized weakness, but denies hitting his head or losing consciousness. He reports ongoing daily alcohol use and smokes up to 3 packs/day. In the ED, CXR revealed large right pleural effusion. CT abd/pelvis also revealed large right pleural effusion, likely malignant with thickened and nodular pleura consistent with metastatic disease. Complete collapse of the right lung is noted on the CT, but there are no acute intra-abdominal or pelvic abnormalities identified. Patient was started on empiric vancomycin and cefepime and ordered for thoracentesis. Pleural fluid revealed exudative fluid. On 3/16, he had acute encephalopathy, difficult to arouse. Family changed status to DNR. He was monitored overnight; on morning of 3/17, he was more alert but remained confused. Due to his suffering, family decided to transition him to full comfort care.   Assessment & Plan:   Principal Problem:   Pleural effusion Active Problems:   Hyponatremia   SIRS (systemic inflammatory response syndrome) (HCC)   Lung nodule   Kidney disease   Coagulopathy (HCC)   Jaundice   Suspected malignant neoplasm   Alcoholism ([place])   Pleural thickening   Malnutrition of moderate degree   Right pleural effusion -Thoracentesis 3/15, exudative fluid. Gram stain negative for organism. Concern for malignancy. Cytology pending -Stop antibiotics -On [place] appears  loculated   Acute toxic encephalopathy -Very lethargic 3/16 -More alert 3/17 but remains confused. Not oriented to place or year (although able to tell me "hospital" and "[place]" when asked). Continues to ask for water (NPO due to waiting for SLP eval when more alert).  -Sleeping comfortably 3/18  Alcohol abuse -Sister states his last alcohol use was 3 weeks ago   Tobacco abuse -Reportedly uses 3 packs daily   Goals of care -Patient has had significant decline since admission. I met with multiple family members including sister and daughter on 3/16. I met with sister again 3/17 and spoke with daughter over the phone 3/17. Unable to reach son. Patient's next of kin are his son and daughter, but sister tells me they are not involved in care and last saw patient 2-3 years ago. Sister tells me that patient is clearly suffering and earlier today asked her for a gun. I spoke with daughter over the phone who states that she and her brother will give away their rights to give sister full medical decision making capacity. Daughter planning to come to hospital later today to fill out any necessary paperwork. Daughter tells me do whatever we need to do to keep him comfortable. She agreed with comfort care even if it meant shortening his life. Sister and daughter agree that they're sure his son would be in agreement with this decision.  -Sister and daughter at bedside 3/18. Awaiting for residential hospice placement   DVT prophylaxis: none, comfort care  Code Status: DNR  Family Communication: Sister, daughter at bedside  Disposition Plan: Full comfort care, awaiting residential hospice placement    Consultants:   None  Procedures:   Thoracentesis 3/15   Antimicrobials:  Anti-infectives (From admission, onward)  Start     Dose/Rate Route Frequency Ordered Stop   11/12/17 2300  vancomycin (VANCOCIN) IVPB 750 mg/150 ml premix  Status:  Discontinued     750 mg 150 mL/hr over 60 Minutes  Intravenous Every 24 hours 11/21/2017 2241 11/13/17 0724   11/12/17 2300  ceFEPIme (MAXIPIME) 2 g in sodium chloride 0.9 % 100 mL IVPB  Status:  Discontinued     2 g 200 mL/hr over 30 Minutes Intravenous Every 24 hours 11/02/2017 2241 11/13/17 0725   11/09/2017 2230  levofloxacin (LEVAQUIN) IVPB 750 mg  Status:  Discontinued     750 mg 100 mL/hr over 90 Minutes Intravenous  Once 11/03/2017 2218 11/28/2017 2226   11/13/2017 2230  aztreonam (AZACTAM) 2 g in sodium chloride 0.9 % 100 mL IVPB  Status:  Discontinued     2 g 200 mL/hr over 30 Minutes Intravenous  Once 11/04/2017 2218 11/06/2017 2226   11/07/2017 2230  vancomycin (VANCOCIN) IVPB 1000 mg/200 mL premix     1,000 mg 200 mL/hr over 60 Minutes Intravenous  Once 11/20/2017 2218 11/12/17 0044   11/23/2017 2230  ceFEPIme (MAXIPIME) 2 g in sodium chloride 0.9 % 100 mL IVPB     2 g 200 mL/hr over 30 Minutes Intravenous  Once 11/05/2017 2226 11/12/17 0044       Subjective: Not able to provide ROS due to decreased mentation    Objective: Vitals:   11/14/17 0400 11/14/17 0900 11/14/17 1347 Nov 28, 2017 0609  BP: 131/74 (!) 146/91 (!) 89/60 (!) 100/59  Pulse: 90  97 97  Resp: (!) _0 Temp: 98 F (36.7 C)  97.8 F (36.6 C)   TempSrc: Oral  Axillary   SpO2: 100%  99% 97%  Weight: 51.6 kg (113 lb 12.1 oz)  51.7 kg (113 lb 15.7 oz)   Height:       No intake or output data in the 24 hours ending November 28, 2017 1120 Filed Weights   11/13/17 0500 11/14/17 0400 11/14/17 1347  Weight: 52.1 kg (114 lb 13.8 oz) 51.6 kg (113 lb 12.1 oz) 51.7 kg (113 lb 15.7 oz)    Examination: General exam: Appears calm and comfortable  Respiratory system: Clear to auscultation anteriorly, no distress on room air CV: RRR    Data Reviewed: I have personally reviewed following labs and imaging studies  CBC: Recent Labs  Lab 10/31/2017 2100 11/12/17 0424 11/13/17 0845  WBC 17.7* 13.0* 11.0*  NEUTROABS  --  10.3*  --   HGB 12.9* 11.7* 10.2*  HCT 37.7* 35.1* 31.3*  MCV  91.5 91.2 93.7  PLT 222 207 846   Basic Metabolic Panel: Recent Labs  Lab 11/18/2017 2100 11/12/17 0424 11/13/17 0845  NA 128* 128* 132*  K 3.4* 3.3* 3.3*  CL 90* 96* 104  CO2 20* 20* 18*  GLUCOSE 132* 120* 94  BUN 48* 43* 22*  CREATININE 1.56* 1.32* 0.94  CALCIUM 8.1* 7.6* 7.5*  MG  --  2.6* 2.4   GFR: Estimated Creatinine Clearance: 58.8 mL/min (by C-G formula based on SCr of 0.94 mg/dL). Liver Function Tests: Recent Labs  Lab 11/06/2017 2100 11/12/17 0424 11/13/17 0845  AST 29 32 45*  ALT _1 ALKPHOS 70 61 61  BILITOT 2.3* 2.3* 1.6*  PROT 5.9* 5.0* 4.7*  ALBUMIN 2.2* 1.9* 1.7*   Recent Labs  Lab 10/29/2017 2100  LIPASE 34   Recent Labs  Lab 11/12/17 0024  AMMONIA 17   Coagulation Profile: Recent Labs  Lab 11/10/2017 2100 11/12/17 0424  INR 1.44 1.35   Cardiac Enzymes: No results for input(s): CKTOTAL, CKMB, CKMBINDEX, TROPONINI in the last 168 hours. BNP (last 3 results) No results for input(s): PROBNP in the last 8760 hours. HbA1C: No results for input(s): HGBA1C in the last 72 hours. CBG: Recent Labs  Lab 11/18/2017 2158 11/12/17 0805 11/13/17 0743  GLUCAP 134* 104* 83   Lipid Profile: No results for input(s): CHOL, HDL, LDLCALC, TRIG, CHOLHDL, LDLDIRECT in the last 72 hours. Thyroid Function Tests: No results for input(s): TSH, T4TOTAL, FREET4, T3FREE, THYROIDAB in the last 72 hours. Anemia Panel: No results for input(s): VITAMINB12, FOLATE, FERRITIN, TIBC, IRON, RETICCTPCT in the last 72 hours. Sepsis Labs: Recent Labs  Lab 11/12/17 0424 11/12/17 0800 11/13/17 1010 11/13/17 1431  LATICACIDVEN 1.8 1.3 3.2* 1.8    Recent Results (from the past 240 hour(s))  Blood Culture (routine x 2)     Status: None (Preliminary result)   Collection Time: 11/22/2017 11:30 PM  Result Value Ref Range Status   Specimen Description BLOOD LEFT ARM  Final   Special Requests   Final    BOTTLES DRAWN AEROBIC ONLY Blood Culture adequate volume   Culture    Final    NO GROWTH 2 DAYS Performed at [facility], [address] [place], [place] [address]    Report Status PENDING  Incomplete  Blood Culture (routine x 2)     Status: None (Preliminary result)   Collection Time: 11/13/2017 11:33 PM  Result Value Ref Range Status   Specimen Description BLOOD RIGHT HAND  Final   Special Requests   Final    BOTTLES DRAWN AEROBIC ONLY Blood Culture results may not be optimal due to an inadequate volume of blood received in culture bottles   Culture   Final    NO GROWTH 2 DAYS Performed at [facility], [place] [address] [place], [place] [address]    Report Status PENDING  Incomplete  Urine culture     Status: None   Collection Time: 11/12/17  1:30 AM  Result Value Ref Range Status   Specimen Description URINE, RANDOM  Final   Special Requests NONE  Final   Culture   Final    NO GROWTH Performed at [facility], [place] [address], [place], [place] [address]    Report Status 11/13/2017 FINAL  Final  Culture, body fluid-bottle     Status: None (Preliminary result)   Collection Time: 11/12/17  2:07 PM  Result Value Ref Range Status   Specimen Description FLUID RIGHT PLEURAL  Final   Special Requests BOTTLES DRAWN AEROBIC AND ANAEROBIC 10CC  Final   Culture   Final    NO GROWTH 2 DAYS Performed at [facility], [place] [address], [place], [place] [address]    Report Status PENDING  Incomplete  Gram stain     Status: None   Collection Time: 11/12/17  2:07 PM  Result Value Ref Range Status   Specimen Description FLUID RIGHT PLEURAL  Final   Special Requests NONE  Final   Gram Stain   Final    FEW WBC PRESENT,BOTH PMN AND MONONUCLEAR NO ORGANISMS SEEN Performed at [facility], [address], [place], [place] [address]    Report Status 11/12/2017 FINAL  Final  MRSA PCR Screening     Status: None   Collection Time: 11/12/17  4:42 PM  Result Value Ref Range Status   MRSA  by PCR NEGATIVE NEGATIVE Final    Comment:         The GeneXpert MRSA Assay (FDA approved for NASAL specimens only), is one component of a comprehensive MRSA colonization surveillance program. It is not intended to diagnose MRSA infection nor to guide or monitor treatment for MRSA infections. Performed at  Hospital Lab, [address] [place], [place] [address]        Radiology Studies: No results found.    Scheduled Meds: . sodium chloride flush  3 mL Intravenous Q12H   Continuous Infusions:    LOS: 3 days    Time spent: 20 minutes    , DO [facility] [email or web address] Password [email or web address] 11/25/2017, 11:20 AM   

## 2017-11-29 NOTE — Social Work (Addendum)
CSW alerted by MD that pt family preference is for [facility], closest to family home. CSW spoke with Cassandra at [facility]. They are requesting pt clinicals be sent to them for review. They currently have no beds available for residential hospice pts.   1:00pm- CSW spoke with pt family at bedside, explained that hospice home had no beds, offered choice but pt family states anywhere outside of [place] is a drive for them and they would prefer to take him home with hospice care. Pt family expressed some concerns with the life expectancy info differing between doctors (some say 1/2 days others say 1-2 weeks). CSW provided emotional support for family and will make referral to RN Case Manager to connect pt and pt family with home hospice services through [facility] signing off. Please consult if any additional needs arise.  Alexander Mt, [place] Work [phone]

## 2017-11-29 NOTE — Death Summary Note (Signed)
 DEATH SUMMARY   Patient Details  Name: Billy Heath MRN: [id number] DOB: 04-Dec-1955  Admission/Discharge Information   Admit Date:  11/20/2017  Date of Death: Date of Death: 11/24/2017  Time of Death: Time of Death: 05-Dec-2208  Length of Stay: 3  Referring Physician: [facility]   Reason(s) for Hospitalization  Dyspnea secondary to right pleural effusion   Diagnoses  Preliminary cause of death:  Secondary Diagnoses (including complications and co-morbidities):  Principal Problem:   Pleural effusion Active Problems:   Hyponatremia   SIRS (systemic inflammatory response syndrome) (HCC)   Lung nodule   Kidney disease   Coagulopathy (HCC)   Jaundice   Suspected malignant neoplasm   Alcoholism ([place])   Pleural thickening   Malnutrition of moderate degree   Acute metabolic encephalopathy   Tobacco abuse   Brief Hospital Course (including significant findings, care, treatment, and services provided and events leading to death)  Billy Heath a 62 y.o.malewith medical history significant forrheumatoidarthritis, alcoholism, and tobacco abuse, now presenting to the emergency department for evaluation of 2-3 weeks of generalized weakness, malaise, abdominal discomfort, nausea, cough, and shortness of breath. Patient reports that the symptoms developed insidiously and have been progressively worsening.He reports falling several times at home due to generalized weakness, but denies hitting his head or losing consciousness. He reports ongoing daily alcohol use and smokes up to 3 packs/day.In the ED, CXR revealed large right pleural effusion. CT abd/pelvis also revealed large right pleural effusion,likely malignant with thickened and nodular pleura consistent with metastatic disease. Complete collapse of the right lung is noted on the CT, but there are no acute intra-abdominal or pelvic abnormalities identified.Patient was started on empiric vancomycin and  cefepimeand ordered for thoracentesis.Pleural fluid revealed exudative fluid, cytology pending. On 3/16, he had acute encephalopathy, difficult to arouse. Family changed status to DNR. He was monitored overnight; on morning of 3/17, he was more alert but remained confused. Due to his suffering, family decided to transition him to full comfort care. Patient passed away on 11/25/2022 at 2210.    Pertinent Labs and Studies  Significant Diagnostic Studies Dg Chest 1 View  Result Date: 11/12/2017 CLINICAL DATA:  Status post right-sided thoracentesis EXAM: CHEST  1 VIEW COMPARISON:  20-Nov-2013 FINDINGS: Interval reduction in right-sided pleural effusion is noted consistent with the given clinical history of thoracentesis. No pneumothorax is identified. Persistent fluid is seen likely related to underlying loculations as noted on prior ultrasound. The left lung is clear. No bony abnormality is noted. IMPRESSION: No pneumothorax following right thoracentesis. Moderate pleural effusion remains with evidence of loculation on recent ultrasound. Electronically Signed   By: Inez Catalina M.D.   On: 11/12/2017 14:17   Dg Chest 2 View  Result Date: Nov 20, 2017 CLINICAL DATA:  62 year old male with cough. EXAM: CHEST - 2 VIEW COMPARISON:  None. FINDINGS: There is a large right pleural effusion with opacification of the mid to lower lung field and compression of the right lower lobe. The left lung is clear. No pneumothorax. The cardiac silhouette is within normal limits. There is osteopenia. No acute osseous pathology. IMPRESSION: Large right pleural effusion with compressive atelectasis of the right lower lobe. Further evaluation with chest CT with contrast is recommended. Electronically Signed   By: Anner Crete M.D.   On: 2017/11/20 23:15   Ct Abdomen Pelvis W Contrast  Result Date: 11-20-17 CLINICAL DATA:  62 year old male with weight loss and generalized abdominal pain. EXAM: CT ABDOMEN AND PELVIS WITH CONTRAST  TECHNIQUE: Multidetector CT imaging of the abdomen and pelvis was performed using the standard protocol following bolus administration of intravenous contrast. CONTRAST:  171mL ISOVUE-300 IOPAMIDOL (ISOVUE-300) INJECTION 61% COMPARISON:  Chest radiograph dated 11/10/2017 FINDINGS: Lower chest: Partially visualized large right pleural effusion with somewhat loculated appearance inferiorly. There is complete collapse of the visualized portion of the right lower lobe. There is thickening of the pleural with areas of nodularity along the inferior right pleural surface concerning for metastatic implants. There is mild mass effect and shift of the mediastinum to the left hemithorax. There is a 4 mm left lower lobe nodule. There is coronary vascular calcification. There is no intra-abdominal free air or free fluid. Hepatobiliary: No focal liver abnormality is seen. No gallstones, gallbladder wall thickening, or biliary dilatation. Pancreas: Unremarkable. No pancreatic ductal dilatation or surrounding inflammatory changes. Spleen: Normal in size without focal abnormality. Adrenals/Urinary Tract: The adrenal glands are unremarkable. There is no hydronephrosis on either side. The visualized ureters and urinary bladder appear unremarkable. Probable small bilateral renal vascular calcification. Stomach/Bowel: There is no bowel obstruction or active inflammation. The appendix is normal. Vascular/Lymphatic: Advanced aortoiliac atherosclerotic disease. No aneurysmal dilatation or evidence of dissection. The SMV, splenic vein, and main portal vein are patent. No portal venous gas. There is no adenopathy. Reproductive: The prostate and seminal vesicles are grossly unremarkable. Other: None Musculoskeletal: Osteopenia.  No acute osseous pathology. IMPRESSION: 1. Partially visualized large right pleural effusion most consistent with malignant effusion. There is thickening and nodularity of the inferior right pleural surface consistent  with metastatic disease. There is associated complete collapse of the visualized portion of the right lung. CT of the chest with IV contrast is recommended for further evaluation. Consider thoracentesis for diagnostic purposes as well as relief of symptoms. 2. A 4 mm left lower lobe pulmonary nodule. 3. No acute intra-abdominal or pelvic pathology. No evidence of metastatic disease in the abdomen and pelvis. 4.  Aortic Atherosclerosis (ICD10-I70.0). Electronically Signed   By: Anner Crete M.D.   On: 11/04/2017 23:54   US Thoracentesis Asp Pleural Space W/img Guide  Result Date: 11/12/2017 INDICATION: Patient with shortness of breath and right pleural effusion. Request is made for diagnostic and therapeutic thoracentesis. EXAM: ULTRASOUND GUIDED DIAGNOSTIC AND THERAPEUTIC RIGHT THORACENTESIS MEDICATIONS: 10 mL 1% lidocaine COMPLICATIONS: None immediate. PROCEDURE: An ultrasound guided thoracentesis was thoroughly discussed with the patient and questions answered. The benefits, risks, alternatives and complications were also discussed. The patient understands and wishes to proceed with the procedure. Written consent was obtained. Ultrasound was performed to localize and mark an adequate pocket of fluid in the right chest. The area was then prepped and draped in the normal sterile fashion. 1% Lidocaine was used for local anesthesia. Under ultrasound guidance a 19 gauge, 7-cm, Yueh catheter was introduced. Thoracentesis was performed. The catheter was removed and a dressing applied. FINDINGS: A total of approximately 1.0 liters of dark, bloody fluid was removed. Samples were sent to the laboratory as requested by the clinical team. IMPRESSION: Successful ultrasound guided diagnostic and therapeutic right thoracentesis yielding 1.0 liters of pleural fluid. Read by: Brynda Greathouse PA-C Electronically Signed   By: Aletta Edouard M.D.   On: 11/12/2017 14:52    Microbiology Recent Results (from the past 240  hour(s))  Blood Culture (routine x 2)     Status: None (Preliminary result)   Collection Time: 11/02/2017 11:30 PM  Result Value Ref Range Status   Specimen Description BLOOD LEFT ARM  Final  Special Requests   Final    BOTTLES DRAWN AEROBIC ONLY Blood Culture adequate volume   Culture   Final    NO GROWTH 3 DAYS Performed at [facility], [place] [address] [place], [place] [address]    Report Status PENDING  Incomplete  Blood Culture (routine x 2)     Status: None (Preliminary result)   Collection Time: 11/25/2017 11:33 PM  Result Value Ref Range Status   Specimen Description BLOOD RIGHT HAND  Final   Special Requests   Final    BOTTLES DRAWN AEROBIC ONLY Blood Culture results may not be optimal due to an inadequate volume of blood received in culture bottles   Culture   Final    NO GROWTH 3 DAYS Performed at [facility], [place] [address] [place], [place] [address]    Report Status PENDING  Incomplete  Urine culture     Status: None   Collection Time: 11/12/17  1:30 AM  Result Value Ref Range Status   Specimen Description URINE, RANDOM  Final   Special Requests NONE  Final   Culture   Final    NO GROWTH Performed at [facility], [place] [address] [place], [place] [address]    Report Status 11/13/2017 FINAL  Final  Culture, body fluid-bottle     Status: None (Preliminary result)   Collection Time: 11/12/17  2:07 PM  Result Value Ref Range Status   Specimen Description FLUID RIGHT PLEURAL  Final   Special Requests BOTTLES DRAWN AEROBIC AND ANAEROBIC 10CC  Final   Culture   Final    NO GROWTH 3 DAYS Performed at [facility], [place] [address], [place], [place] [address]    Report Status PENDING  Incomplete  Gram stain     Status: None   Collection Time: 11/12/17  2:07 PM  Result Value Ref Range Status   Specimen Description FLUID RIGHT PLEURAL  Final   Special Requests NONE  Final   Gram Stain   Final    FEW WBC PRESENT,BOTH PMN AND MONONUCLEAR NO  ORGANISMS SEEN Performed at [facility], [address], [place], [place] [address]    Report Status 11/12/2017 FINAL  Final  MRSA PCR Screening     Status: None   Collection Time: 11/12/17  4:42 PM  Result Value Ref Range Status   MRSA by PCR NEGATIVE NEGATIVE Final    Comment:        The GeneXpert MRSA Assay (FDA approved for NASAL specimens only), is one component of a comprehensive MRSA colonization surveillance program. It is not intended to diagnose MRSA infection nor to guide or monitor treatment for MRSA infections. Performed at [facility], [place] [address] [place], [place] [address]     Lab Basic Metabolic Panel: Recent Labs  Lab 11/28/2017 2100 11/12/17 0424 11/13/17 0845  NA 128* 128* 132*  K 3.4* 3.3* 3.3*  CL 90* 96* 104  CO2 20* 20* 18*  GLUCOSE 132* 120* 94  BUN 48* 43* 22*  CREATININE 1.56* 1.32* 0.94  CALCIUM 8.1* 7.6* 7.5*  MG  --  2.6* 2.4   Liver Function Tests: Recent Labs  Lab 11/14/2017 2100 11/12/17 0424 11/13/17 0845  AST 29 32 45*  ALT 18 18 25   ALKPHOS 70 61 61  BILITOT 2.3* 2.3* 1.6*  PROT 5.9* 5.0* 4.7*  ALBUMIN 2.2* 1.9* 1.7*   Recent Labs  Lab  2100  LIPASE 34  Recent Labs  Lab 11/12/17 0024  AMMONIA 17   CBC: Recent Labs  Lab 11/09/2017 2100 11/12/17 0424 11/13/17 0845  WBC 17.7* 13.0* 11.0*  NEUTROABS  --  10.3*  --   HGB 12.9* 11.7* 10.2*  HCT 37.7* 35.1* 31.3*  MCV 91.5 91.2 93.7  PLT 222 207 153   Cardiac Enzymes: No results for input(s): CKTOTAL, CKMB, CKMBINDEX, TROPONINI in the last 168 hours. Sepsis Labs: Recent Labs  Lab 11/19/2017 2100  11/12/17 0424 11/12/17 0800 11/13/17 0845 11/13/17 1010 11/13/17 1431  WBC 17.7*  --  13.0*  --  11.0*  --   --   LATICACIDVEN  --    < > 1.8 1.3  --  3.2* 1.8   < > = values in this interval not displayed.    Procedures/Operations  Right thoracentesis    [name] 11/23/2017, 7:10 AM

## 2017-11-29 NOTE — Progress Notes (Signed)
Nutrition Brief Note  Chart reviewed. Pt now transitioning to comfort care.  No further nutrition interventions warranted at this time.  Please re-consult as needed.   Orine Goga A. Maille Halliwell, RD, LDN, CDE Pager: [phone] After hours Pager: [phone]  

## 2017-11-29 NOTE — Care Management Note (Signed)
Case Management Note  Patient Details  Name: Billy Heath MRN: [id number] Date of Birth: July 11, 1956  Subjective/Objective:                    Action/Plan:  Discussed discharge planning with family at bedside. Patient's sister Arrie Aran [phone] is going to take patient home with hospice , she prefers [facility]. Patient will need hospital bed delivered before he is discharged. Patient will need ambulance transport home.   Patient will discharge to [facility] home which is :  [place], [place], [place] [address]   Dawn [phone].  Referral given to Cassandra with [facility]. Vito Backers will order the hospital bed. Expected Discharge Date:                  Expected Discharge Plan:  Home w Hospice Care  In-House Referral:  Nutrition  Discharge planning Services  CM Consult  Post Acute Care Choice:  NA Choice offered to:  Sibling  DME Arranged:  Hospital bed DME Agency:     HH Arranged:  NA HH Agency:  [facility]  Status of Service:  In process, will continue to follow  If discussed at Long Length of Stay Meetings, dates discussed:    Additional Comments:  Marilu Favre, RN 11/24/17, 1:15 PM

## 2017-11-29 DEATH — deceased

## 2019-09-25 IMAGING — CT CT ABD-PELV W/ CM
2 of 5 series · 15 of 46 positions shown, 17 images · IV contrast (APPLIED)
Comparison: Chest radiograph dated 11/11/2017

CLINICAL DATA: 62-year-old male with weight loss and generalized
abdominal pain.

EXAM:
CT ABDOMEN AND PELVIS WITH CONTRAST
TECHNIQUE: Multidetector CT imaging of the abdomen and pelvis was performed
using the standard protocol following bolus administration of
intravenous contrast.
CONTRAST:  100mL [id number] IOPAMIDOL ([id number]) INJECTION 61%

[Series 3: abd/ pelvis 5.0 i30f 2 · axial · 0.62mm/px · z∈[+689,+1074]mm · 12 of 87 slices shown, 14 images]
[im 5/87  soft-tissue]
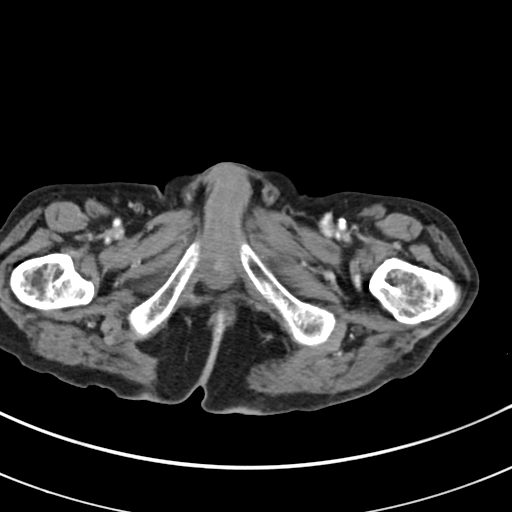
[im 5/87  bone]
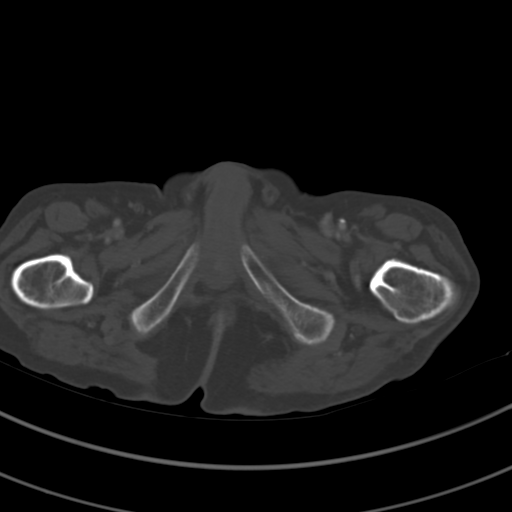
[im 13/87  soft-tissue]
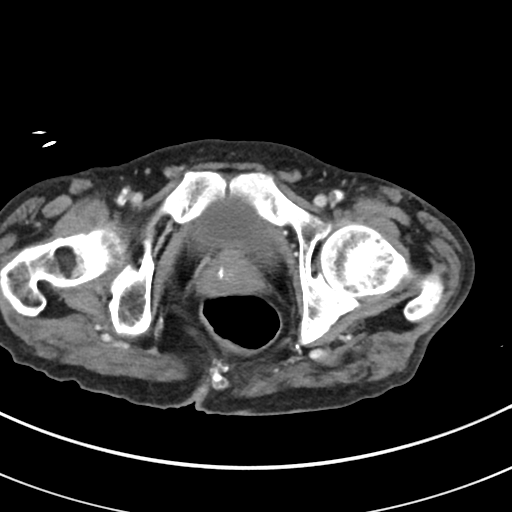
[im 18/87  soft-tissue]
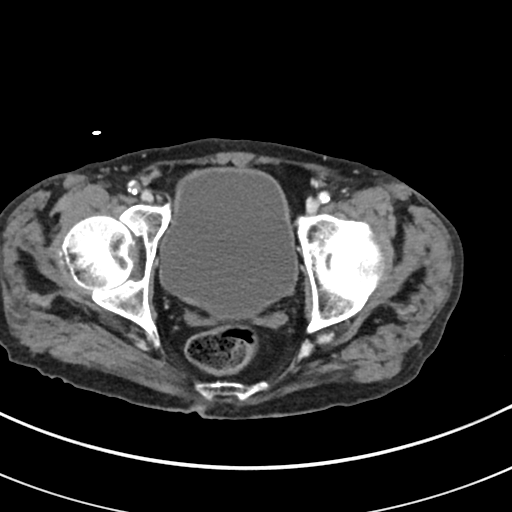
[im 26/87  soft-tissue]
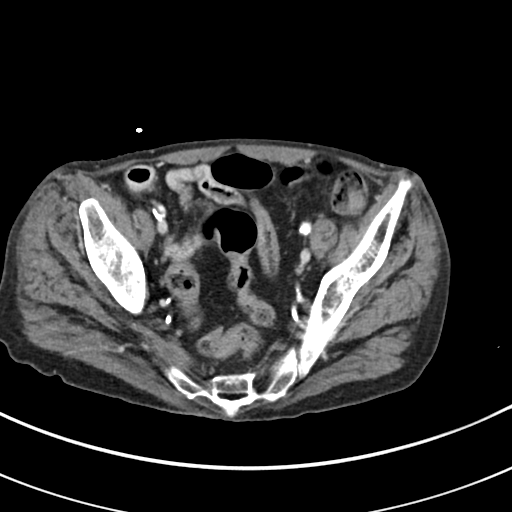
[im 35/87  soft-tissue]
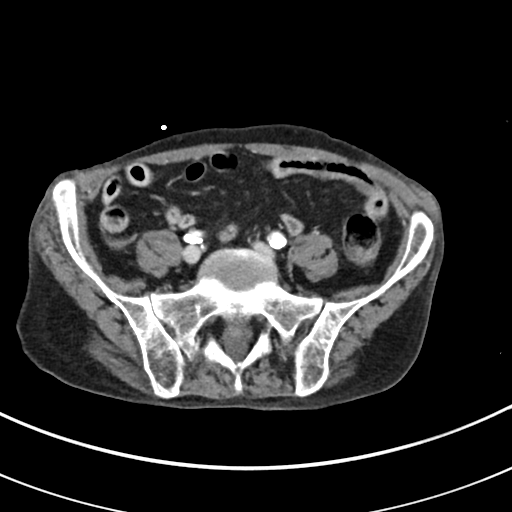
[im 39/87  soft-tissue]
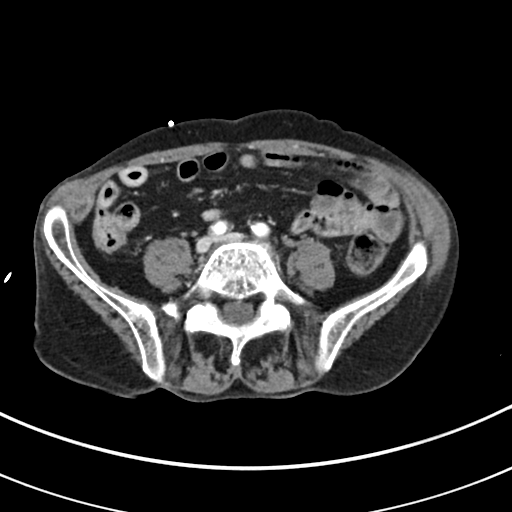
[im 48/87  soft-tissue]
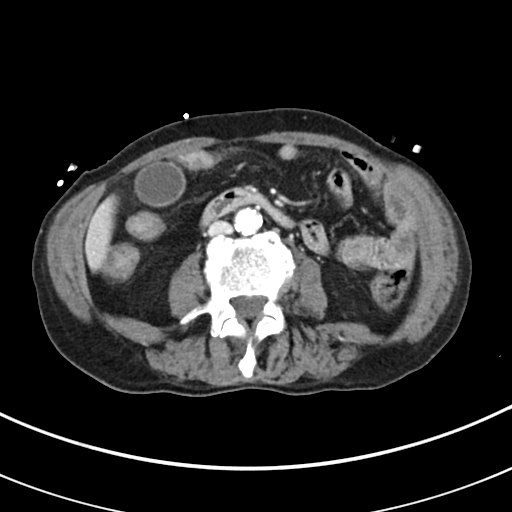
[im 52/87  soft-tissue]
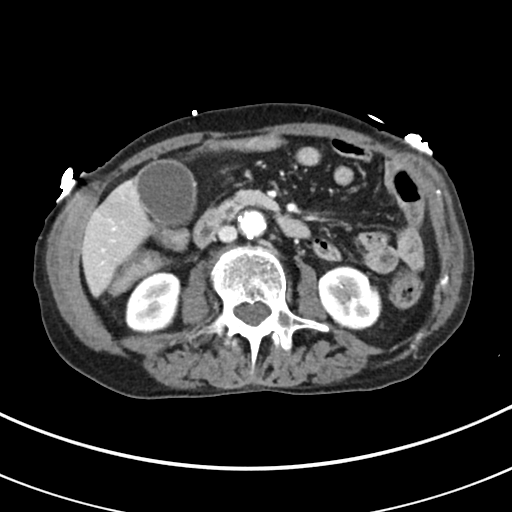
[im 61/87  soft-tissue]
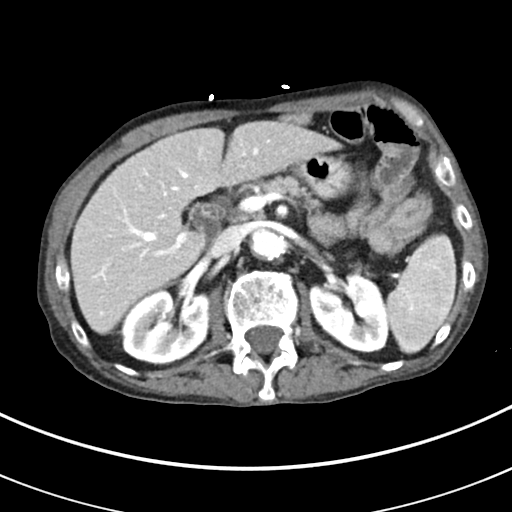
[im 61/87  bone]
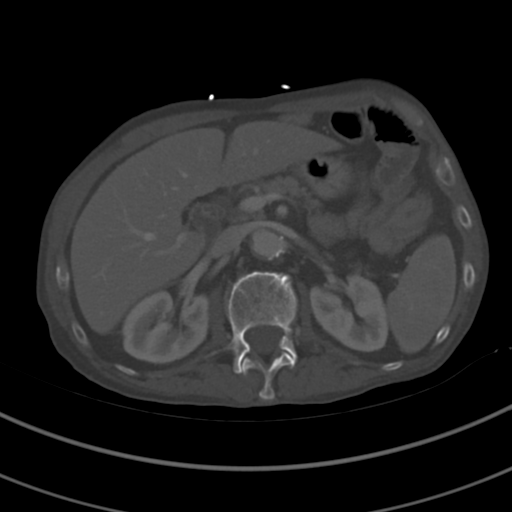
[im 69/87  soft-tissue]
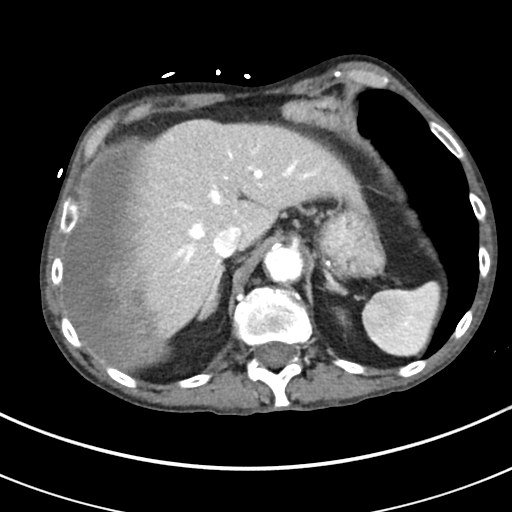
[im 74/87  soft-tissue]
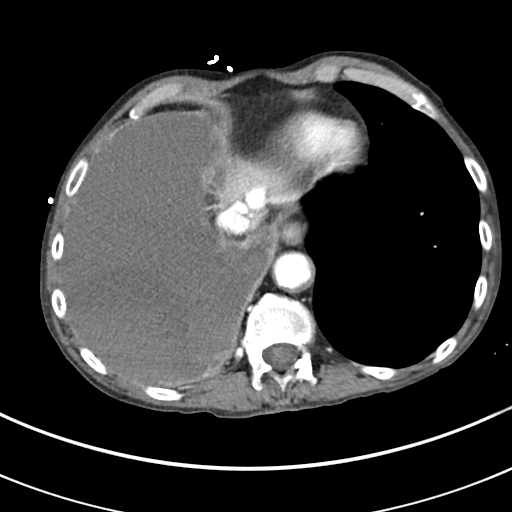
[im 82/87  soft-tissue]
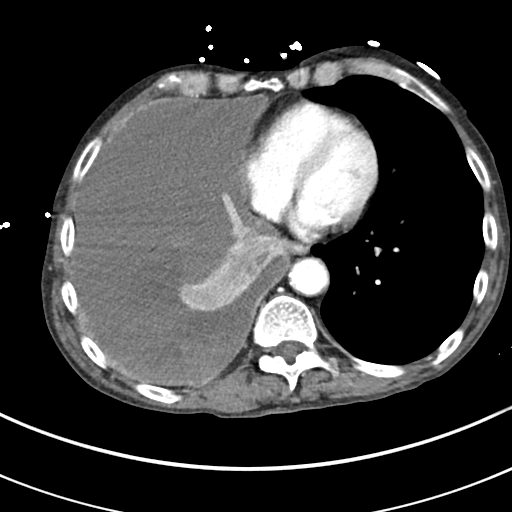

[Series 6: coronal soft tissue · coronal · 0.68mm/px · 3 of 72 slices shown]
[im 24/72  soft-tissue]
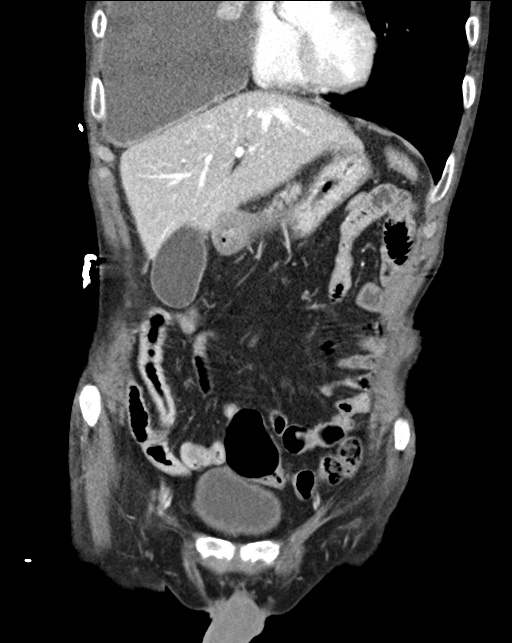
[im 32/72  soft-tissue]
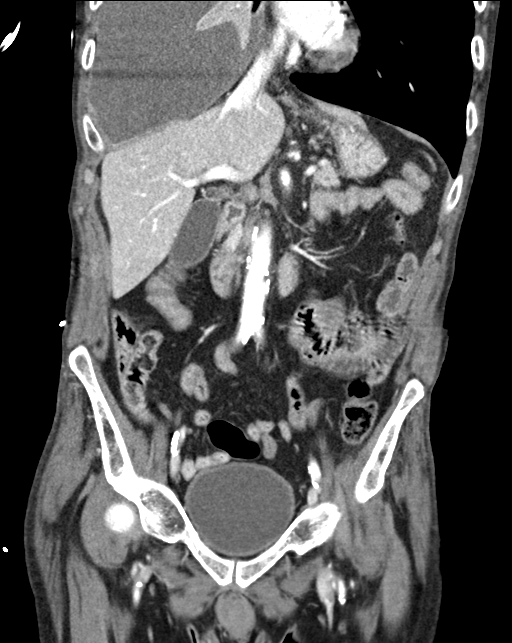
[im 40/72  soft-tissue]
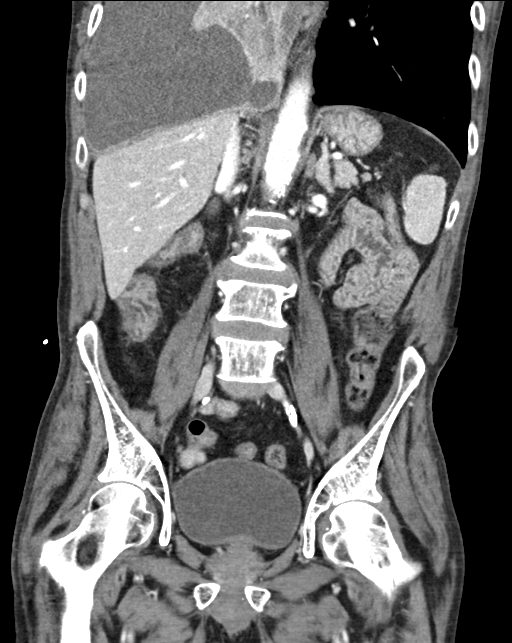

[15 of 46 positions shown; findings below may reference images not displayed]

FINDINGS: Lower chest: Partially visualized large right pleural effusion with
somewhat loculated appearance inferiorly. There is complete collapse
of the visualized portion of the right lower lobe. There is
thickening of the pleural with areas of nodularity along the
inferior right pleural surface concerning for metastatic implants.
There is mild mass effect and shift of the mediastinum to the left
hemithorax. There is a 4 mm left lower lobe nodule. There is
coronary vascular calcification.

There is no intra-abdominal free air or free fluid.

Hepatobiliary: No focal liver abnormality is seen. No gallstones,
gallbladder wall thickening, or biliary dilatation.

Pancreas: Unremarkable. No pancreatic ductal dilatation or
surrounding inflammatory changes.

Spleen: Normal in size without focal abnormality.

Adrenals/Urinary Tract: The adrenal glands are unremarkable. There
is no hydronephrosis on either side. The visualized ureters and
urinary bladder appear unremarkable. Probable small bilateral renal
vascular calcification.

Stomach/Bowel: There is no bowel obstruction or active inflammation.
The appendix is normal.

Vascular/Lymphatic: Advanced aortoiliac atherosclerotic disease. No
aneurysmal dilatation or evidence of dissection. The SMV, splenic
vein, and main portal vein are patent. No portal venous gas. There
is no adenopathy.

Reproductive: The prostate and seminal vesicles are grossly
unremarkable.

Other: None

Musculoskeletal: Osteopenia.  No acute osseous pathology.
IMPRESSION: 1. Partially visualized large right pleural effusion most consistent
with malignant effusion. There is thickening and nodularity of the
inferior right pleural surface consistent with metastatic disease.
There is associated complete collapse of the visualized portion of
the right lung. CT of the chest with IV contrast is recommended for
further evaluation. Consider thoracentesis for diagnostic purposes
as well as relief of symptoms.
2. A 4 mm left lower lobe pulmonary nodule.
3. No acute intra-abdominal or pelvic pathology. No evidence of
metastatic disease in the abdomen and pelvis.
4.  Aortic Atherosclerosis ([id number]).

## 2019-09-26 IMAGING — CR DG CHEST 1V
1 series · 1 of 1 positions shown · non-contrast
Comparison: 11/11/2013

CLINICAL DATA: Status post right-sided thoracentesis

EXAM:
CHEST  1 VIEW

[chest ap]
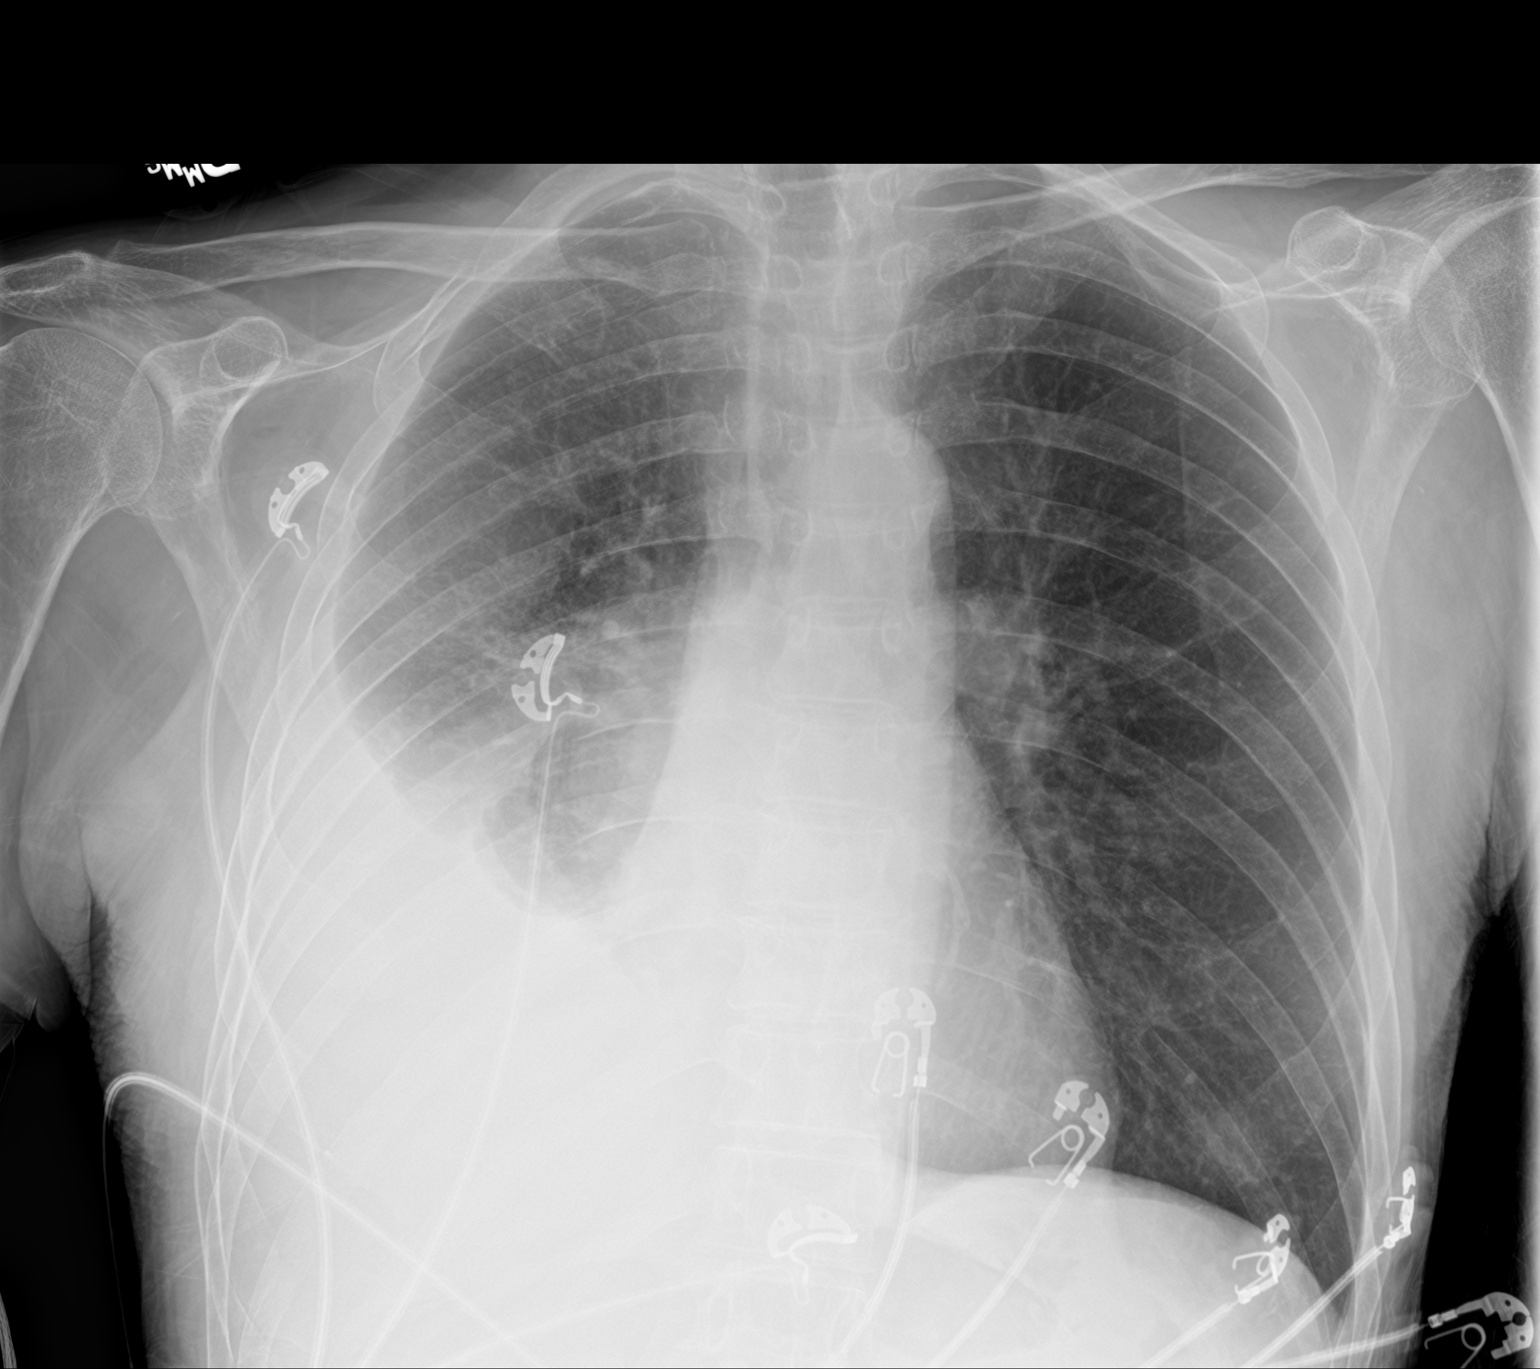

[1 of 1 positions shown; findings below may reference images not displayed]

FINDINGS: Interval reduction in right-sided pleural effusion is noted
consistent with the given clinical history of thoracentesis. No
pneumothorax is identified. Persistent fluid is seen likely related
to underlying loculations as noted on prior ultrasound. The left
lung is clear. No bony abnormality is noted.
IMPRESSION: No pneumothorax following right thoracentesis. Moderate pleural
effusion remains with evidence of loculation on recent ultrasound.
